# Patient Record
Sex: Female | Born: 1985
Health system: Southern US, Community
[De-identification: ages and names within clinical notes are randomized; demographics above are authoritative.]

## PROBLEM LIST (undated history)

## (undated) ENCOUNTER — Emergency Department (HOSPITAL_COMMUNITY): Admission: EM | Payer: Medicaid Other | Source: Home / Self Care

## (undated) ENCOUNTER — Inpatient Hospital Stay (HOSPITAL_COMMUNITY): Payer: Self-pay

## (undated) DIAGNOSIS — Z789 Other specified health status: Secondary | ICD-10-CM

## (undated) HISTORY — PX: INDUCED ABORTION: SHX677

---

## 2013-10-23 ENCOUNTER — Emergency Department (INDEPENDENT_AMBULATORY_CARE_PROVIDER_SITE_OTHER)
Admission: EM | Admit: 2013-10-23 | Discharge: 2013-10-23 | Disposition: A | Discharge status: Home / Self Care | Payer: Self-pay | Source: Home / Self Care | Attending: Emergency Medicine | Admitting: Emergency Medicine

## 2013-10-23 ENCOUNTER — Encounter (HOSPITAL_COMMUNITY): Payer: Self-pay | Admitting: Emergency Medicine

## 2013-10-23 DIAGNOSIS — M549 Dorsalgia, unspecified: Secondary | ICD-10-CM

## 2013-10-23 LAB — POCT URINALYSIS DIP (DEVICE)
BILIRUBIN URINE: NEGATIVE
GLUCOSE, UA: NEGATIVE mg/dL
Ketones, ur: NEGATIVE mg/dL
LEUKOCYTES UA: NEGATIVE
Nitrite: NEGATIVE
Protein, ur: NEGATIVE mg/dL
Specific Gravity, Urine: >=1.03 (ref 1.005–1.030)
Urobilinogen, UA: 1 mg/dL (ref 0.0–1.0)
pH: 7 (ref 5.0–8.0)

## 2013-10-23 MED ORDER — CYCLOBENZAPRINE HCL 10 MG PO TABS: 10.0000 mg | ORAL_TABLET | Freq: Three times a day (TID) | ORAL | Status: DC | PRN

## 2013-10-23 NOTE — ED Notes (Signed)
Patient c/o back pain , states that she woke up this morning with body aches and a slight headache, denies fever, diarrhea or any urinary symptoms
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
## 2013-10-23 NOTE — ED Provider Notes (Signed)
Medical screening examination/treatment/procedure(s) were performed by non-physician practitioner and as supervising physician I was immediately available for consultation/collaboration.  Serene Kopf, M.D.  Nimra Puccinelli C Malaya Cagley, MD 10/23/13 2143 
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
## 2015-03-06 ENCOUNTER — Emergency Department (HOSPITAL_COMMUNITY)
Admission: EM | Admit: 2015-03-06 | Discharge: 2015-03-06 | Disposition: A | Discharge status: Home / Self Care | Payer: Self-pay | Attending: Emergency Medicine | Admitting: Emergency Medicine

## 2015-03-06 ENCOUNTER — Encounter (HOSPITAL_COMMUNITY): Payer: Self-pay | Admitting: *Deleted

## 2015-03-06 DIAGNOSIS — N923 Ovulation bleeding: Secondary | ICD-10-CM

## 2015-03-06 DIAGNOSIS — N938 Other specified abnormal uterine and vaginal bleeding: Secondary | ICD-10-CM | POA: Insufficient documentation

## 2015-03-06 DIAGNOSIS — Z720 Tobacco use: Secondary | ICD-10-CM | POA: Insufficient documentation

## 2015-03-06 LAB — CBC
HCT: 37 % (ref 36.0–46.0)
HEMOGLOBIN: 12.1 g/dL (ref 12.0–15.0)
MCH: 29.9 pg (ref 26.0–34.0)
MCHC: 32.7 g/dL (ref 30.0–36.0)
MCV: 91.4 fL (ref 78.0–100.0)
PLATELETS: 341 10*3/uL (ref 150–400)
RBC: 4.05 MIL/uL (ref 3.87–5.11)
RDW: 12.9 % (ref 11.5–15.5)
WBC: 7.7 10*3/uL (ref 4.0–10.5)

## 2015-03-06 LAB — I-STAT BETA HCG BLOOD, ED (MC, WL, AP ONLY): I-stat hCG, quantitative: <5 m[IU]/mL (ref <5)

## 2015-03-06 NOTE — ED Provider Notes (Signed)
CSN: [id number]     Arrival date & time 03/06/15  1820 History   First MD Initiated Contact with Patient 03/06/15 2004     Chief Complaint  Patient presents with  . Vaginal Bleeding   (Consider location/radiation/quality/duration/timing/severity/associated sxs/prior Treatment) HPI Margaret Spence is a 29 year old female without any significant medical history presenting with vaginal bleeding. She states her normal menstrual cycle ended 4 days ago. Then 2 days ago she began spotting followed by today when she began to have heavier vaginal bleeding. She reports wearing a tampon and a pad that she has had to change every 2-3 hours. On review of the nursing notes there is mention of abdominal pain with vomiting and diarrhea and mention of chemotherapy pills. Patient denies any abdominal pain, vomiting or diarrhea during my interview. Denies any dysuria or vaginal symptoms, or fever.   History reviewed. No pertinent past medical history. History reviewed. No pertinent past surgical history. History reviewed. No pertinent family history. History  Substance Use Topics  . Smoking status: Current Some Day Smoker  . Smokeless tobacco: Not on file  . Alcohol Use: Not on file     Comment: occ   OB History    No data available     Review of Systems  Constitutional: Negative for fever and chills.  HENT: Negative for sore throat.   Eyes: Negative for visual disturbance.  Respiratory: Negative for cough and shortness of breath.   Cardiovascular: Negative for chest pain and leg swelling.  Gastrointestinal: Negative for nausea, vomiting and diarrhea.  Genitourinary: Positive for vaginal bleeding and menstrual problem. Negative for dysuria, urgency, frequency, flank pain, decreased urine volume, vaginal discharge, vaginal pain and dyspareunia.  Musculoskeletal: Negative for myalgias.  Skin: Negative for rash.  Neurological: Negative for weakness, numbness and headaches.      Allergies   Mushroom extract complex  Home Medications   Prior to Admission medications   Medication Sig Start Date End Date Taking? Authorizing Provider  acetaminophen (TYLENOL) 500 MG tablet Take 500 mg by mouth every 6 (six) hours as needed for mild pain, moderate pain or headache.   Yes Historical Provider, MD  cyclobenzaprine (FLEXERIL) 10 MG tablet Take 1 tablet (10 mg total) by mouth 3 (three) times daily as needed for muscle spasms. Patient not taking: Reported on 03/06/2015 10/23/13   Graylon Good, PA-C   BP 114/69 mmHg  Pulse 76  Temp(Src) 98.2 F (36.8 C) (Oral)  Resp 20  Ht  (1.651 m)  Wt 158 lb 9.6 oz (71.94 kg)  BMI 26.39 kg/m2  SpO2 100%  LMP 03/02/2015 Physical Exam  Constitutional: She appears well-developed and well-nourished. No distress.  HENT:  Head: Normocephalic and atraumatic.  Mouth/Throat: Oropharynx is clear and moist.  Eyes: Conjunctivae are normal.  Neck: Neck supple.  Cardiovascular: Normal rate, regular rhythm and intact distal pulses.   Pulmonary/Chest: Effort normal and breath sounds normal. No respiratory distress. She has no wheezes. She has no rales. She exhibits no tenderness.  Abdominal: Soft. There is no tenderness. There is no rigidity, no rebound, no guarding, no CVA tenderness, no tenderness at McBurney's point and negative Murphy's sign.  No TTP, soft, no masses  Musculoskeletal: She exhibits no tenderness.  Lymphadenopathy:    She has no cervical adenopathy.  Neurological: She is alert.  Skin: Skin is warm and dry. No rash noted. She is not diaphoretic.  Psychiatric: She has a normal mood and affect.  Nursing note and vitals reviewed.  ED Course  Procedures (including critical care time) Labs Review Labs Reviewed  CBC  I-STAT BETA HCG BLOOD, ED ([facility], [facility], [facility] ONLY)    Imaging Review No results found.   EKG Interpretation None      MDM   Final diagnoses:  Vaginal bleeding between periods   29 yo with vaginal bleeding  between menstrual cycles. Negative pregnancy test, Hgb normal, no abd pain, vaginal pain, or dysuria. Discussed with patient no emergent process currently but can be regulated with hormone adjustment but will need to be seen by outpatient gyn. Pt is well-appearing, in no acute distress and vital signs reviewed and not concerning. She appears safe to be discharged.  Discharge include resources to establish care with a Gyn for follow-up. Return precautions provided. Pt aware of plan and in agreement.     Filed Vitals:   03/06/15 1842 03/06/15 1919 03/06/15 2115  BP: 101/69 114/69 109/73  Pulse: 70 76 66  Temp: 98.2 F (36.8 C)    TempSrc: Oral    Resp: 22 20 20   Height: 5\' 5"  (1.651 m)    Weight: 158 lb 9.6 oz (71.94 kg)    SpO2: 99% 100% 100%   Meds given in ED:  Medications - No data to display  Discharge Medication List as of 03/06/2015  9:08 PM       Harle BattiestElizabeth Vonnie Ligman, NP 03/07/15 1600  Nelva Nayobert Beaton, MD 03/10/15 [phone]

## 2015-03-06 NOTE — ED Notes (Signed)
Pt here for vaginal bleeding. sts that she had a full cycle and then stopped. sts a few days later started having spotting and then clots.
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
## 2015-03-06 NOTE — Discharge Instructions (Signed)
Please follow the directions provided. Be sure to [facility] for further evaluation of your vaginal bleeding between periods. Don't hesitate to return for any new, worsening, or concerning symptoms.   SEEK IMMEDIATE MEDICAL CARE IF:  You pass out.  You are changing pads every 15 to 30 minutes.  You have abdominal pain.  You have a fever.  You become sweaty or weak.  You are passing large blood clots from the vagina.  You start to feel nauseous and vomit.   Emergency Department Resource Guide 1) Find a Doctor and Pay Out of Pocket Although you won't have to find out who is covered by your insurance plan, it is a good idea to ask around and get recommendations. You will then need to call the office and see if the doctor you have chosen will accept you as a new patient and what types of options they offer for patients who are self-pay. Some doctors offer discounts or will set up payment plans for their patients who do not have insurance, but you will need to ask so you aren't surprised when you get to your appointment.  2) Contact Your Local Health Department Not all health departments have doctors that can see patients for sick visits, but many do, so it is worth a call to see if yours does. If you don't know where your local health department is, you can check in your phone book. The CDC also has a tool to help you locate your state's health department, and many state websites also have listings of all of their local health departments.  3) Find a Walk-in Clinic If your illness is not likely to be very severe or complicated, you may want to try a walk in clinic. These are popping up all over the country in pharmacies, drugstores, and shopping centers. They're usually staffed by nurse practitioners or physician assistants that have been trained to treat common illnesses and complaints. They're usually fairly quick and inexpensive. However, if you have serious  medical issues or chronic medical problems, these are probably not your best option.  No Primary Care Doctor: - Call Health Connect at  [phone] - they can help you locate a primary care doctor that  accepts your insurance, provides certain services, etc. - Physician Referral Service- [phone]  Chronic Pain Problems: Organization         Address  Phone   Notes  [facility]  [phone] Patients need to be referred by their primary care doctor.   Medication Assistance: Organization         Address  Phone   Notes  [facility] [address] [place], [place] [address] [phone] --Must be a resident of [place] -- Must have NO insurance coverage whatsoever (no Medicaid/ Medicare, etc.) -- The pt. MUST have a primary care doctor that directs their care regularly and follows them in the community   Med[facility]ist  [phone]   [employer]  [phone]    Agencies that provide inexpensive medical care: Organization         Address  Phone   Notes  [facility]  [phone]   [facility]    [phone]   [facility] [address] [place], [place] [address] [phone]   [facility] [address], [place] [phone]   Planned Parenthood    (  [phone]   [facility]    [phone]   [facility]  [address], [place] Phone:  [phone], Fax:  [phone] Hours of Operation:  9 am - 6 pm, M-F.  Also accepts Medicaid/Medicare and self-pay.  [facility] for [address] [place], [address], [place] Phone: [phone], Fax: [phone]. Hours of Operation:  8:30 am - 5:30 pm, M-F.  Also accepts Medicaid and self-pay.  [facility] [place] [address], [place] Phone: [phone]   [place], [place], [place] [phone], Ext. [phone] Mondays & Thursdays: 7-9 AM.  First 15 patients are seen on a first come, first serve basis.    [place] Providers:  Organization         Address  Phone   Notes  [facility] [address], [place] [phone] Also accepts self-pay patients.  [facility] [id number] [place], [place]  [phone]   [place], [address], [place] [phone]   [facility] [address] [place] [phone]   Lucianne Lei [address], [place]   [phone] Only accepts [place] Access [place] patients after they have their name applied to their card.   Self-Pay (no insurance) in [facility]:  Organization         Address  Phone   Notes  Sickle Cell Patients, [facility] [place] [phone]   [facility] [place] [phone]   [facility] [place]  [place], [place], [place] [phone]   [facility]/Dr. Osei-Bonsu  [address], [place] or [address], [place] [phone] Phone number for both [place] and [place] locations is the same.  Urgent Medical and [facility] [address] [place] [phone]   [facility] [address] [place] or [address] [phone]   [facility] [address], [place] [phone], phone; [phone], fax Sees patients 1st and 3rd Saturday of every month.  Must not qualify for public or private insurance (i.e. Medicaid, Medicare, [place] Health Choice, Veterans' Benefits)  Household income should be no more than 200% of the poverty level The clinic cannot treat you if you are pregnant or think you are pregnant  Sexually  transmitted diseases are not treated at the clinic.    Dental Care: Organization         Address  Phone  Notes  [facility] [place] [phone] Accepts children up to age 71 who are enrolled in [place] or [place]; pregnant women with a Medicaid card; and children who have applied for Medicaid or [place] Health Choice, but were declined, whose parents can pay a reduced fee at time of service.  [facility] of [facility]  [address], [place] [phone] Accepts children up to age 85 who are enrolled in [place] or [place]; pregnant women with a Medicaid card; and children who have applied for Medicaid or [place], but were declined, whose parents can pay a  reduced fee at time of service.  [place] [facility] Access PRO[facility]  [place] [phone] Patients are seen by appointment only. Walk-ins are not accepted. [place] will see patients 53 years of age and older. Monday - Tuesday (8am-5pm) Most Wednesdays (8:30-5pm) $30 per visit, cash only  [facility] Adult Dental Access PROGRAM  [address], [facility] [phone] Patients are seen by appointment only. Walk-ins are not accepted. [place] will see patients 61 years of age and older. One Wednesday Evening (Monthly: Volunteer Based).  $30 per visit, cash only  [place]  [phone] for adults; Children under age 32, call Graduate Pediatric Dentistry at [phone]. Children aged 23-14, please call [phone] to request a pediatric application.  Dental services are provided in all areas of dental care including fillings, crowns and bridges, complete and partial dentures, implants, gum treatment, root canals, and extractions. Preventive care is also provided. Treatment is provided to both adults and children. Patients are  selected via a lottery and there is often a waiting list.   [facility] [address], [place]  [phone] [email or web address]   [facility] [address] [place], [place] [phone], Ext. [phone] Second and Fourth Thursday of each month, opens at 6:30 AM; Clinic ends at 9 AM.  Patients are seen on a first-come first-served basis, and a limited number are seen during each clinic.   [facility]  [address] Hillard Danker [place], [place] [phone]   Eligibility Requirements You must have lived in [place], [place], or [place] counties for at least the last three months.   You cannot be eligible for state or federal sponsored [employer], including [employer], [place], or [employer].   You generally cannot be eligible for healthcare insurance through your employer.    How to apply: Eligibility screenings are held every Tuesday and Wednesday afternoon from 1:00 pm until 4:00 pm. You do not need an appointment for the interview!  [facility] [address], [place], [place]   [place]  Schneider Department  [place]  [phone]    Behavioral Health Resources in the Community: Intensive Outpatient Programs Organization         Address  Phone  Notes  [place] [address] [place], [place] [phone]   [facility] Outpatient [address] [place], [place]   ADS: Alcohol & Drug Svcs [address], [place], [place]   [place] [address],  [place], [place] or [phone]   Substance Abuse Resources Organization         Address  Phone  Notes  Alcohol and Drug Services  [phone]   [place]  [phone]   The [place]   Chinita Pester  [phone]     Residential & Outpatient Substance Abuse Program  [phone]   Psychological Services Organization         Address  Phone  Notes  [facility] [place]  [place]  [phone]   [place] [address], [place] or [phone]    Mobile Crisis Teams Organization         Address  Phone  Notes  [facility], Mobile Crisis Care Unit  [phone]   [facility] Psychotherapeutic Services  [address] [place], [place] [phone]   [facility] [address] [place] [phone]    Self-Help/Support Groups Organization         Address  Phone             Notes  [facility] of [place] - variety of support groups  [phone]- I7437963 Call for more information  Narcotics Anonymous (NA), [facility] [address], [employer] [place]  2 meetings at this location   Statistician         Address  Phone  Notes  [facility] [address],    [place]  [phone]   [facility]  [address] [place] [address]272, [place], [place] [phone]   [facility] [address] [place], [place] [phone] Admissions: 8am-3pm M-F  [facility] [address],    [place], [place] [phone]   [facility] [address] [place], [place], [place] [phone]   [facility] [address]  [place], [place] [phone]   [facility] - Intensive Outpatient [address], [place], [place] [phone]   [facility] (Addiction Recovery Care [facility]oc[facility] [address]  [place], [place] [phone] or [phone]   [facility] Services (RTS) [address], [place], [place] [phone] Accepts Medicaid  [facility] [place] [address],  [place] [phone] Substance Abuse/Addiction Treatment   [facility] Organization         Address  Phone  Notes  [facility]  [phone]   Angie Fava, PhD [address] Ervin Knack [place], [place]   [phone] or [phone]   [facility]   [address] [place], [place] [phone]   [facility] [address] [place], [place] [phone] Insurance/Medicaid/sponsorship through [facility] and Families [address]                                    [place], [place] [phone] Therapy/tele-psych/case  [facility] [address], [place] [phone]    Dr. Lolly Mustache  [phone]   [facility] [place]  United Way [facility] Dept. 1) [address] [place] 2) [address] [place] 3)  [address], [place] [phone]  [phone]   [facility] Child Abuse Hotline 626-703-2280 or 7801410699 (After Hours)

## 2015-12-16 ENCOUNTER — Emergency Department (INDEPENDENT_AMBULATORY_CARE_PROVIDER_SITE_OTHER)
Admission: EM | Admit: 2015-12-16 | Discharge: 2015-12-16 | Disposition: A | Discharge status: Home / Self Care | Payer: BLUE CROSS/BLUE SHIELD | Source: Home / Self Care | Attending: Emergency Medicine | Admitting: Emergency Medicine

## 2015-12-16 ENCOUNTER — Encounter (HOSPITAL_COMMUNITY): Payer: Self-pay | Admitting: Emergency Medicine

## 2015-12-16 DIAGNOSIS — R197 Diarrhea, unspecified: Secondary | ICD-10-CM | POA: Diagnosis not present

## 2015-12-16 DIAGNOSIS — R141 Gas pain: Secondary | ICD-10-CM | POA: Diagnosis not present

## 2015-12-16 MED ORDER — ONDANSETRON HCL 4 MG PO TABS: 4.0000 mg | ORAL_TABLET | Freq: Three times a day (TID) | ORAL | Status: DC | PRN

## 2015-12-16 NOTE — Discharge Instructions (Signed)
I suspect your diarrhea and gas pains are coming from your change in diet. I do not see any sign of gallbladder problems, infection, or other serious condition. If you develop more nausea, you can fill the prescription for Zofran. You can take over-the-counter Gas-X to help with the gas pains. Please try and avoid fast food and greasy foods. Follow-up if not improving in the next several days.

## 2015-12-16 NOTE — ED Provider Notes (Signed)
CSN: [id number]     Arrival date & time 12/16/15  1614 History   First MD Initiated Contact with Patient 12/16/15 1655     Chief Complaint  Patient presents with  . Abdominal Pain   (Consider location/radiation/quality/duration/timing/severity/associated sxs/prior Treatment) HPI  She is a 30 year old woman here for evaluation of abdominal pain. She states that since Tuesday she has been having diarrhea and crampy left-sided abdominal pain. She states the diarrhea has improved. She will only develop the pain before a bowel movement or passing gas. Once she passes gas or has a bowel movement, the pain resolves. She has had some vomiting this morning. She denies nausea. No dysuria or urinary symptoms. No vaginal discharge. No fevers or chills. She has not tried any medications.  She does state for the last week she has been eating a lot more fast food and greasy foods than normal. She states she typically cooks at home.  History reviewed. No pertinent past medical history. History reviewed. No pertinent past surgical history. No family history on file. Social History  Substance Use Topics  . Smoking status: Current Some Day Smoker  . Smokeless tobacco: None  . Alcohol Use: None     Comment: occ   OB History    No data available     Review of Systems As in history of present illness Allergies  Mushroom extract complex  Home Medications   Prior to Admission medications   Medication Sig Start Date End Date Taking? Authorizing Provider  acetaminophen (TYLENOL) 500 MG tablet Take 500 mg by mouth every 6 (six) hours as needed for mild pain, moderate pain or headache.    Historical Provider, MD  ondansetron (ZOFRAN) 4 MG tablet Take 1 tablet (4 mg total) by mouth every 8 (eight) hours as needed for nausea or vomiting. 12/16/15   Charm RingsErin J Jessey Stehlin, MD   Meds Ordered and Administered this Visit  Medications - No data to display  BP 114/59 mmHg  Pulse 65  Temp(Src) 98.5 F (36.9 C) (Oral)   SpO2 100%  LMP 11/30/2015 No data found.   Physical Exam  Constitutional: She is oriented to person, place, and time. She appears well-developed and well-nourished. No distress.  Cardiovascular: Normal rate.   Pulmonary/Chest: Effort normal.  Abdominal: Soft. Bowel sounds are normal. She exhibits no distension. There is no tenderness. There is no rebound and no guarding.  Neurological: She is alert and oriented to person, place, and time.    ED Course  Procedures (including critical care time)  Labs Review Labs Reviewed - No data to display  Imaging Review No results found.    MDM   1. Diarrhea, unspecified type   2. Gas pain    No abdominal pain currently. Abdominal exam is completely benign. I suspect her symptoms are due to the diet change. Discussed symptomatic treatment with Zofran if needed and over-the-counter Gas-X. Return to work note given at her request. Return precautions reviewed.    Charm RingsErin J Wes Lezotte, MD 12/16/15 1740

## 2015-12-16 NOTE — ED Notes (Addendum)
Left lower abdominal cramping since Tuesday.  Intermittent pain.  Associated with diarrhea, reports 3-4 episodes of diarrhea, "not really sure".  Reports episodes of diarrhea has slowed.  Report her job wants a note clearing her for work.  Patient reports increase in flatulence. Reports very painful cramping prior to passing gas

## 2016-02-05 ENCOUNTER — Encounter (HOSPITAL_COMMUNITY): Payer: Self-pay | Admitting: *Deleted

## 2016-02-05 ENCOUNTER — Inpatient Hospital Stay (HOSPITAL_COMMUNITY)
Admission: AD | Admit: 2016-02-05 | Discharge: 2016-02-05 | Disposition: A | Discharge status: Home / Self Care | Payer: BLUE CROSS/BLUE SHIELD | Source: Ambulatory Visit | Attending: Family Medicine | Admitting: Family Medicine

## 2016-02-05 DIAGNOSIS — N926 Irregular menstruation, unspecified: Secondary | ICD-10-CM

## 2016-02-05 DIAGNOSIS — N760 Acute vaginitis: Secondary | ICD-10-CM | POA: Insufficient documentation

## 2016-02-05 DIAGNOSIS — B9689 Other specified bacterial agents as the cause of diseases classified elsewhere: Secondary | ICD-10-CM

## 2016-02-05 DIAGNOSIS — Z113 Encounter for screening for infections with a predominantly sexual mode of transmission: Secondary | ICD-10-CM

## 2016-02-05 DIAGNOSIS — Z30011 Encounter for initial prescription of contraceptive pills: Secondary | ICD-10-CM

## 2016-02-05 DIAGNOSIS — N925 Other specified irregular menstruation: Secondary | ICD-10-CM | POA: Insufficient documentation

## 2016-02-05 HISTORY — DX: Other specified health status: Z78.9

## 2016-02-05 LAB — URINALYSIS, ROUTINE W REFLEX MICROSCOPIC
BILIRUBIN URINE: NEGATIVE
Glucose, UA: NEGATIVE mg/dL
Hgb urine dipstick: NEGATIVE
Ketones, ur: NEGATIVE mg/dL
LEUKOCYTES UA: NEGATIVE
NITRITE: NEGATIVE
Protein, ur: NEGATIVE mg/dL
SPECIFIC GRAVITY, URINE: 1.025 (ref 1.005–1.030)
pH: 6.5 (ref 5.0–8.0)

## 2016-02-05 LAB — POCT PREGNANCY, URINE: Preg Test, Ur: NEGATIVE

## 2016-02-05 LAB — WET PREP, GENITAL
Sperm: NONE SEEN
Trich, Wet Prep: NONE SEEN
Yeast Wet Prep HPF POC: NONE SEEN

## 2016-02-05 LAB — CBC
HEMATOCRIT: 34.6 % — AB (ref 36.0–46.0)
Hemoglobin: 11.5 g/dL — ABNORMAL LOW (ref 12.0–15.0)
MCH: 30.6 pg (ref 26.0–34.0)
MCHC: 33.2 g/dL (ref 30.0–36.0)
MCV: 92 fL (ref 78.0–100.0)
Platelets: 365 10*3/uL (ref 150–400)
RBC: 3.76 MIL/uL — AB (ref 3.87–5.11)
RDW: 13.3 % (ref 11.5–15.5)
WBC: 7.1 10*3/uL (ref 4.0–10.5)

## 2016-02-05 MED ORDER — NORGESTIMATE-ETH ESTRADIOL 0.25-35 MG-MCG PO TABS: 1.0000 | ORAL_TABLET | Freq: Every day | ORAL | Status: DC

## 2016-02-05 MED ORDER — METRONIDAZOLE 500 MG PO TABS: 500.0000 mg | ORAL_TABLET | Freq: Two times a day (BID) | ORAL | Status: DC

## 2016-02-05 NOTE — MAU Note (Signed)
Started spotting on the 10th, little earlier she expected her period, period came on 12-16, had a day off then started spotting again.  Sometimes it is bright red, sometimes dark red or brown. Starting to get an odor.

## 2016-02-05 NOTE — MAU Provider Note (Signed)
History     CSN: [id number]  Arrival date and time: 02/05/16 1416   First Provider Initiated Contact with Patient 02/05/16 1506       Chief Complaint  Patient presents with  . Vaginal Bleeding  . vag odor    HPI  Margaret Spence is a 30 y.o. female who presents for prolonged menses & vaginal odor. Vaginal spotting that started 4/10, became heavier 4/12-4/16 like a normal periods, then returned to light red spotting since then. Reports history of regular periods; states period comes every month but on different days and last different amounts of time.  Has not been on anything for birth control in over 2 years. Reports vaginal odor in the last few days but has noticed any new discharge. Denies itching/irritation. Desires STD testing at this time.   OB History    Gravida Para Term Preterm AB TAB SAB Ectopic Multiple Living   Past Medical History  Diagnosis Date  . Medical history non-contributory     Past Surgical History  Procedure Laterality Date  . Vaginal delivery    . Induced abortion      History reviewed. No pertinent family history.  Social History  Substance Use Topics  . Smoking status: Never Smoker   . Smokeless tobacco: None  . Alcohol Use: Yes     Comment: 1-2 X per week    Allergies:  Allergies  Allergen Reactions  . Mushroom Extract Complex Rash    Prescriptions prior to admission  Medication Sig Dispense Refill Last Dose  . acetaminophen (TYLENOL) 500 MG tablet Take 500 mg by mouth every 6 (six) hours as needed for mild pain, moderate pain or headache.   1 week ago  . ondansetron (ZOFRAN) 4 MG tablet Take 1 tablet (4 mg total) by mouth every 8 (eight) hours as needed for nausea or vomiting. 20 tablet 0     Review of Systems  Constitutional: Negative.   Gastrointestinal: Negative.   Genitourinary: Negative for dysuria.       + vaginal bleeding    Physical Exam   Blood pressure 108/63, pulse 74, temperature  98.2 F (36.8 C), temperature source Oral, resp. rate 16, height  (1.575 m), weight 150 lb 9.6 oz (68.312 kg), last menstrual period 01/22/2016.  Physical Exam  Nursing note and vitals reviewed. Constitutional: She is oriented to person, place, and time. She appears well-developed and well-nourished. No distress.  HENT:  Head: Normocephalic and atraumatic.  Eyes: Conjunctivae are normal. Right eye exhibits no discharge. Left eye exhibits no discharge. No scleral icterus.  Neck: Normal range of motion.  Cardiovascular: Normal rate, regular rhythm and normal heart sounds.   No murmur heard. Respiratory: Effort normal and breath sounds normal. No respiratory distress. She has no wheezes.  GI: Soft.  Genitourinary: Uterus normal. Cervix exhibits no motion tenderness and no friability. No bleeding in the vagina. Vaginal discharge (minimal amount of thin tan discharge with foul odor) found.  No blood on exam  Neurological: She is alert and oriented to person, place, and time.  Skin: Skin is warm and dry. She is not diaphoretic.  Psychiatric: She has a normal mood and affect. Her behavior is normal. Judgment and thought content normal.    MAU Course  Procedures Results for orders placed or performed during the hospital encounter of 02/05/16 (from the past 24 hour(s))  Urinalysis, Routine w reflex microscopic (not  at [facility])     Status: None   Collection Time: 02/05/16  2:39 PM  Result Value Ref Range   Color, Urine YELLOW YELLOW   APPearance CLEAR CLEAR   Specific Gravity, Urine 1.025 1.005 - 1.030   pH 6.5 5.0 - 8.0   Glucose, UA NEGATIVE NEGATIVE mg/dL   Hgb urine dipstick NEGATIVE NEGATIVE   Bilirubin Urine NEGATIVE NEGATIVE   Ketones, ur NEGATIVE NEGATIVE mg/dL   Protein, ur NEGATIVE NEGATIVE mg/dL   Nitrite NEGATIVE NEGATIVE   Leukocytes, UA NEGATIVE NEGATIVE  Pregnancy, urine POC     Status: None   Collection Time: 02/05/16  2:40 PM  Result Value Ref Range   Preg Test, Ur  NEGATIVE NEGATIVE  CBC     Status: Abnormal   Collection Time: 02/05/16  2:55 PM  Result Value Ref Range   WBC 7.1 4.0 - 10.5 K/uL   RBC 3.76 (L) 3.87 - 5.11 MIL/uL   Hemoglobin 11.5 (L) 12.0 - 15.0 g/dL   HCT 16.134.6 (L) 09.636.0 - 04.546.0 %   MCV 92.0 78.0 - 100.0 fL   MCH 30.6 26.0 - 34.0 pg   MCHC 33.2 30.0 - 36.0 g/dL   RDW 40.913.3 81.111.5 - 91.415.5 %   Platelets 365 150 - 400 K/uL  Wet prep, genital     Status: Abnormal   Collection Time: 02/05/16  3:34 PM  Result Value Ref Range   Yeast Wet Prep HPF POC NONE SEEN NONE SEEN   Trich, Wet Prep NONE SEEN NONE SEEN   Clue Cells Wet Prep HPF POC PRESENT (A) NONE SEEN   WBC, Wet Prep HPF POC MANY (A) NONE SEEN   Sperm NONE SEEN     MDM UPT negative No blood on exam CBC, HIV, RPR, GC/CT, wet prep Pt would like oral contraception - no contraindications. Will start patient on COC and give list of gyns in area for management.   Assessment and Plan  A: 1. Abnormal menses   2. Screen for STD (sexually transmitted disease)   3. Encounter for initial prescription of contraceptive pills   4. BV (bacterial vaginosis)     P: Discharge home Rx sprintec & flagyl (no alcohol) List of ob/gyn providers given GC/CT, HIV, RPR pending  Judeth Hornrin Minnie Legros 02/05/2016, 3:06 PM

## 2016-02-05 NOTE — Discharge Instructions (Signed)
Contraception Choices Contraception (birth control) is the use of any methods or devices to prevent pregnancy. Below are some methods to help avoid pregnancy. HORMONAL METHODS   Contraceptive implant. This is a thin, plastic tube containing progesterone hormone. It does not contain estrogen hormone. Your health care provider inserts the tube in the inner part of the upper arm. The tube can remain in place for up to 3 years. After 3 years, the implant must be removed. The implant prevents the ovaries from releasing an egg (ovulation), thickens the cervical mucus to prevent sperm from entering the uterus, and thins the lining of the inside of the uterus.  Progesterone-only injections. These injections are given every 3 months by your health care provider to prevent pregnancy. This synthetic progesterone hormone stops the ovaries from releasing eggs. It also thickens cervical mucus and changes the uterine lining. This makes it harder for sperm to survive in the uterus.  Birth control pills. These pills contain estrogen and progesterone hormone. They work by preventing the ovaries from releasing eggs (ovulation). They also cause the cervical mucus to thicken, preventing the sperm from entering the uterus. Birth control pills are prescribed by a health care provider.Birth control pills can also be used to treat heavy periods.  Minipill. This type of birth control pill contains only the progesterone hormone. They are taken every day of each month and must be prescribed by your health care provider.  Birth control patch. The patch contains hormones similar to those in birth control pills. It must be changed once a week and is prescribed by a health care provider.  Vaginal ring. The ring contains hormones similar to those in birth control pills. It is left in the vagina for 3 weeks, removed for 1 week, and then a new one is put back in place. The patient must be comfortable inserting and removing the ring  from the vagina.A health care provider's prescription is necessary.  Emergency contraception. Emergency contraceptives prevent pregnancy after unprotected sexual intercourse. This pill can be taken right after sex or up to 5 days after unprotected sex. It is most effective the sooner you take the pills after having sexual intercourse. Most emergency contraceptive pills are available without a prescription. Check with your pharmacist. Do not use emergency contraception as your only form of birth control. BARRIER METHODS   Female condom. This is a thin sheath (latex or rubber) that is worn over the penis during sexual intercourse. It can be used with spermicide to increase effectiveness.  Female condom. This is a soft, loose-fitting sheath that is put into the vagina before sexual intercourse.  Diaphragm. This is a soft, latex, dome-shaped barrier that must be fitted by a health care provider. It is inserted into the vagina, along with a spermicidal jelly. It is inserted before intercourse. The diaphragm should be left in the vagina for 6 to 8 hours after intercourse.  Cervical cap. This is a round, soft, latex or plastic cup that fits over the cervix and must be fitted by a health care provider. The cap can be left in place for up to 48 hours after intercourse.  Sponge. This is a soft, circular piece of polyurethane foam. The sponge has spermicide in it. It is inserted into the vagina after wetting it and before sexual intercourse.  Spermicides. These are chemicals that kill or block sperm from entering the cervix and uterus. They come in the form of creams, jellies, suppositories, foam, or tablets. They do not require a  prescription. They are inserted into the vagina with an applicator before having sexual intercourse. The process must be repeated every time you have sexual intercourse. INTRAUTERINE CONTRACEPTION  Intrauterine device (IUD). This is a T-shaped device that is put in a woman's uterus  during a menstrual period to prevent pregnancy. There are 2 types:  Copper IUD. This type of IUD is wrapped in copper wire and is placed inside the uterus. Copper makes the uterus and fallopian tubes produce a fluid that kills sperm. It can stay in place for 10 years.  Hormone IUD. This type of IUD contains the hormone progestin (synthetic progesterone). The hormone thickens the cervical mucus and prevents sperm from entering the uterus, and it also thins the uterine lining to prevent implantation of a fertilized egg. The hormone can weaken or kill the sperm that get into the uterus. It can stay in place for 3-5 years, depending on which type of IUD is used. PERMANENT METHODS OF CONTRACEPTION  Female tubal ligation. This is when the woman's fallopian tubes are surgically sealed, tied, or blocked to prevent the egg from traveling to the uterus.  Hysteroscopic sterilization. This involves placing a small coil or insert into each fallopian tube. Your doctor uses a technique called hysteroscopy to do the procedure. The device causes scar tissue to form. This results in permanent blockage of the fallopian tubes, so the sperm cannot fertilize the egg. It takes about 3 months after the procedure for the tubes to become blocked. You must use another form of birth control for these 3 months.  Female sterilization. This is when the female has the tubes that carry sperm tied off (vasectomy).This blocks sperm from entering the vagina during sexual intercourse. After the procedure, the man can still ejaculate fluid (semen). NATURAL PLANNING METHODS  Natural family planning. This is not having sexual intercourse or using a barrier method (condom, diaphragm, cervical cap) on days the woman could become pregnant.  Calendar method. This is keeping track of the length of each menstrual cycle and identifying when you are fertile.  Ovulation method. This is avoiding sexual intercourse during ovulation.  Symptothermal  method. This is avoiding sexual intercourse during ovulation, using a thermometer and ovulation symptoms.  Post-ovulation method. This is timing sexual intercourse after you have ovulated. Regardless of which type or method of contraception you choose, it is important that you use condoms to protect against the transmission of sexually transmitted infections (STIs). Talk with your health care provider about which form of contraception is most appropriate for you.   This information is not intended to replace advice given to you by your health care provider. Make sure you discuss any questions you have with your health care provider.   Document Released: 09/30/2005 Document Revised: 10/05/2013 Document Reviewed: 03/25/2013 Elsevier Interactive Patient Education 2016 [employer]       [facility] [place] [facility]   Francoise CeoBernard Marshall      Phone: [phone]  [facility]     Phone: [phone]  [facility] [place]  Phone: [phone]  [facility] [place]  Phone: [phone]  [facility]    Phone: [phone]   [facility]    Phone: [phone]  [facility]    Phone: [phone]  [facility]    Phone: [phone]  [facility]    Phone: [phone]  [facility] Phone: [phone]   [facility]  Phone: [phone]  [facility]    Phone: [phone]  [facility]   Phone: [phone]  [facility]      Phone: [phone]  [facility]    Phone: [phone]  [facility]     Phone: [phone]

## 2016-02-06 LAB — GC/CHLAMYDIA PROBE AMP (~~LOC~~) NOT AT ARMC
CHLAMYDIA, DNA PROBE: POSITIVE — AB
Neisseria Gonorrhea: NEGATIVE

## 2016-02-06 LAB — HIV ANTIBODY (ROUTINE TESTING W REFLEX): HIV SCREEN 4TH GENERATION: NONREACTIVE

## 2016-02-07 ENCOUNTER — Telehealth (HOSPITAL_COMMUNITY): Payer: Self-pay | Admitting: *Deleted

## 2016-02-07 DIAGNOSIS — A749 Chlamydial infection, unspecified: Secondary | ICD-10-CM

## 2016-02-07 MED ORDER — AZITHROMYCIN 500 MG PO TABS: ORAL_TABLET | ORAL | Status: DC

## 2016-02-07 NOTE — Telephone Encounter (Signed)
Telephone call to patient regarding positive chlamydia culture, patient notified.  Rx routed to patient's pharmacy for treatment.  Instructed patient to notify her partner for treatment and to abstain from sex for seven days post treatment.  Report faxed to health department.  

## 2016-10-19 LAB — OB RESULTS CONSOLE GBS: STREP GROUP B AG: POSITIVE

## 2017-02-22 ENCOUNTER — Encounter (HOSPITAL_COMMUNITY): Payer: Self-pay

## 2017-02-22 ENCOUNTER — Inpatient Hospital Stay (HOSPITAL_COMMUNITY)
Admission: AD | Admit: 2017-02-22 | Discharge: 2017-02-22 | Disposition: A | Discharge status: Home / Self Care | Payer: Medicaid Other | Source: Ambulatory Visit | Attending: Family Medicine | Admitting: Family Medicine

## 2017-02-22 DIAGNOSIS — E860 Dehydration: Secondary | ICD-10-CM

## 2017-02-22 DIAGNOSIS — Z3A08 8 weeks gestation of pregnancy: Secondary | ICD-10-CM | POA: Insufficient documentation

## 2017-02-22 DIAGNOSIS — O99281 Endocrine, nutritional and metabolic diseases complicating pregnancy, first trimester: Secondary | ICD-10-CM | POA: Diagnosis not present

## 2017-02-22 DIAGNOSIS — O219 Vomiting of pregnancy, unspecified: Secondary | ICD-10-CM | POA: Insufficient documentation

## 2017-02-22 LAB — OB RESULTS CONSOLE HIV ANTIBODY (ROUTINE TESTING): HIV: NONREACTIVE

## 2017-02-22 LAB — OB RESULTS CONSOLE HEPATITIS B SURFACE ANTIGEN: HEP B S AG: NEGATIVE

## 2017-02-22 LAB — URINALYSIS, ROUTINE W REFLEX MICROSCOPIC
BILIRUBIN URINE: NEGATIVE
Bacteria, UA: NONE SEEN
Glucose, UA: NEGATIVE mg/dL
HGB URINE DIPSTICK: NEGATIVE
KETONES UR: 80 mg/dL — AB
NITRITE: NEGATIVE
Protein, ur: 30 mg/dL — AB
Specific Gravity, Urine: 1.03 (ref 1.005–1.030)
pH: 6 (ref 5.0–8.0)

## 2017-02-22 LAB — POCT PREGNANCY, URINE: Preg Test, Ur: POSITIVE — AB

## 2017-02-22 LAB — COMPREHENSIVE METABOLIC PANEL
ALT: 12 U/L — ABNORMAL LOW (ref 14–54)
AST: 17 U/L (ref 15–41)
Albumin: 4 g/dL (ref 3.5–5.0)
Alkaline Phosphatase: 52 U/L (ref 38–126)
Anion gap: 8 (ref 5–15)
BILIRUBIN TOTAL: 1 mg/dL (ref 0.3–1.2)
BUN: 7 mg/dL (ref 6–20)
CO2: 25 mmol/L (ref 22–32)
Calcium: 9.5 mg/dL (ref 8.9–10.3)
Chloride: 99 mmol/L — ABNORMAL LOW (ref 101–111)
Creatinine, Ser: 0.59 mg/dL (ref 0.44–1.00)
GFR calc Af Amer: >60 mL/min (ref >60)
GFR calc non Af Amer: >60 mL/min (ref >60)
Glucose, Bld: 93 mg/dL (ref 65–99)
POTASSIUM: 3.8 mmol/L (ref 3.5–5.1)
Sodium: 132 mmol/L — ABNORMAL LOW (ref 135–145)
Total Protein: 7.3 g/dL (ref 6.5–8.1)

## 2017-02-22 LAB — OB RESULTS CONSOLE RUBELLA ANTIBODY, IGM: RUBELLA: NON-IMMUNE/NOT IMMUNE

## 2017-02-22 LAB — CBC
HEMATOCRIT: 35.3 % — AB (ref 36.0–46.0)
Hemoglobin: 12.3 g/dL (ref 12.0–15.0)
MCH: 31.5 pg (ref 26.0–34.0)
MCHC: 34.8 g/dL (ref 30.0–36.0)
MCV: 90.5 fL (ref 78.0–100.0)
Platelets: 378 10*3/uL (ref 150–400)
RBC: 3.9 MIL/uL (ref 3.87–5.11)
RDW: 13.6 % (ref 11.5–15.5)
WBC: 8.9 10*3/uL (ref 4.0–10.5)

## 2017-02-22 LAB — OB RESULTS CONSOLE ABO/RH: RH TYPE: NEGATIVE

## 2017-02-22 LAB — OB RESULTS CONSOLE GC/CHLAMYDIA
Chlamydia: NEGATIVE
GC PROBE AMP, GENITAL: NEGATIVE

## 2017-02-22 LAB — OB RESULTS CONSOLE ANTIBODY SCREEN: ANTIBODY SCREEN: NEGATIVE

## 2017-02-22 LAB — OB RESULTS CONSOLE RPR: RPR: NONREACTIVE

## 2017-02-22 MED ORDER — FAMOTIDINE IN NACL 20-0.9 MG/50ML-% IV SOLN
20.0000 mg | Freq: Once | INTRAVENOUS | Status: AC
  Administered 2017-02-22: 20 mg via INTRAVENOUS

## 2017-02-22 MED ORDER — FAMOTIDINE IN NACL 20-0.9 MG/50ML-% IV SOLN
INTRAVENOUS | Status: AC
  Filled 2017-02-22: qty 50

## 2017-02-22 MED ORDER — PROMETHAZINE HCL 25 MG/ML IJ SOLN
25.0000 mg | Freq: Once | INTRAMUSCULAR | Status: AC
  Administered 2017-02-22: 25 mg via INTRAVENOUS
  Filled 2017-02-22: qty 1

## 2017-02-22 MED ORDER — DEXTROSE 5 % IN LACTATED RINGERS IV BOLUS
1000.0000 mL | Freq: Once | INTRAVENOUS | Status: AC
  Administered 2017-02-22: 1000 mL via INTRAVENOUS

## 2017-02-22 MED ORDER — DIPHENHYDRAMINE HCL 50 MG/ML IJ SOLN: 12.5000 mg | Freq: Once | INTRAMUSCULAR | Status: DC

## 2017-02-22 MED ORDER — PROMETHAZINE HCL 25 MG PO TABS: 25.0000 mg | ORAL_TABLET | Freq: Four times a day (QID) | ORAL | 0 refills | Status: DC | PRN

## 2017-02-22 MED ORDER — DIMENHYDRINATE 50 MG/ML IJ SOLN: 50.0000 mg | Freq: Once | INTRAMUSCULAR | Status: DC

## 2017-02-22 MED ORDER — M.V.I. ADULT IV INJ
Freq: Once | INTRAVENOUS | Status: AC
  Administered 2017-02-22: 05:00:00 via INTRAVENOUS
  Filled 2017-02-22: qty 10

## 2017-02-22 NOTE — MAU Note (Signed)
Uncontrolled vomiting and nausea since this morning. 2 sharp pains in low abd within last few hours. Diarrhea x 2-3 today. No fever. Home pregnancy test positive on Wednesday. No bleeding.

## 2017-02-22 NOTE — MAU Provider Note (Signed)
History     CSN: [id number]  Arrival date and time: 02/22/17 [id number]   First Provider Initiated Contact with Patient 02/22/17 [phone]      Chief Complaint  Patient presents with  . Emesis During Pregnancy   HPI Margaret Spence is a 31 y.o. G3P1011 at 4359w2d who presents with nausea & vomiting. Symptoms began yesterday. Reports vomiting countless times today. Has had several episodes of loose stools today as well. Denies abdominal pain, fever, vaginal bleeding. Has not started prenatal care yet.   OB History    Gravida Para Term Preterm AB Living   3 1 1   1 1    SAB TAB Ectopic Multiple Live Births     1            Past Medical History:  Diagnosis Date  . Medical history non-contributory     Past Surgical History:  Procedure Laterality Date  . INDUCED ABORTION    . VAGINAL DELIVERY      History reviewed. No pertinent family history.  Social History  Substance Use Topics  . Smoking status: Never Smoker  . Smokeless tobacco: Never Used  . Alcohol use Yes     Comment: 1-2 X per week    Allergies:  Allergies  Allergen Reactions  . Other     Tree nuts - rash  . Mushroom Extract Complex Rash    Prescriptions Prior to Admission  Medication Sig Dispense Refill Last Dose  . acetaminophen (TYLENOL) 500 MG tablet Take 500 mg by mouth every 6 (six) hours as needed for mild pain, moderate pain or headache. Reported on 02/05/2016   Not Taking at Unknown time  . azithromycin (ZITHROMAX) 500 MG tablet Take two tablets by mouth once. 2 tablet 0   . metroNIDAZOLE (FLAGYL) 500 MG tablet Take 1 tablet (500 mg total) by mouth 2 (two) times daily. 14 tablet 0   . norgestimate-ethinyl estradiol (SPRINTEC 28) 0.25-35 MG-MCG tablet Take 1 tablet by mouth daily. 1 Package 2     Review of Systems  Constitutional: Negative.   Gastrointestinal: Positive for diarrhea, nausea and vomiting. Negative for abdominal pain and constipation.  Genitourinary: Negative.    Physical Exam    Blood pressure (!) 104/55, pulse 71, temperature 98.1 F (36.7 C), temperature source Oral, resp. rate 15, height 5\' 5"  (1.651 m), weight 131 lb (59.4 kg), last menstrual period 12/26/2016, SpO2 99 %.  Physical Exam  Nursing note and vitals reviewed. Constitutional: She is oriented to person, place, and time. She appears well-developed and well-nourished. No distress.  HENT:  Head: Normocephalic and atraumatic.  Eyes: Conjunctivae are normal. Right eye exhibits no discharge. Left eye exhibits no discharge. No scleral icterus.  Neck: Normal range of motion.  Respiratory: Effort normal. No respiratory distress.  Neurological: She is alert and oriented to person, place, and time.  Skin: Skin is warm and dry. She is not diaphoretic.  Psychiatric: She has a normal mood and affect. Her behavior is normal. Judgment and thought content normal.    MAU Course  Procedures Results for orders placed or performed during the hospital encounter of 02/22/17 (from the past 24 hour(s))  Urinalysis, Routine w reflex microscopic (not [facility])     Status: Abnormal   Collection Time: 02/22/17  2:36 AM  Result Value Ref Range   Color, Urine YELLOW YELLOW   APPearance HAZY (A) CLEAR   Specific Gravity, Urine 1.030 1.005 - 1.030   pH 6.0 5.0 - 8.0  Glucose, UA NEGATIVE NEGATIVE mg/dL   Hgb urine dipstick NEGATIVE NEGATIVE   Bilirubin Urine NEGATIVE NEGATIVE   Ketones, ur 80 (A) NEGATIVE mg/dL   Protein, ur 30 (A) NEGATIVE mg/dL   Nitrite NEGATIVE NEGATIVE   Leukocytes, UA TRACE (A) NEGATIVE   RBC / HPF 0-5 0 - 5 RBC/hpf   WBC, UA 0-5 0 - 5 WBC/hpf   Bacteria, UA NONE SEEN NONE SEEN   Squamous Epithelial / LPF 6-30 (A) NONE SEEN   Mucous PRESENT   Pregnancy, urine POC     Status: Abnormal   Collection Time: 02/22/17  2:46 AM  Result Value Ref Range   Preg Test, Ur POSITIVE (A) NEGATIVE  CBC     Status: Abnormal   Collection Time: 02/22/17  3:20 AM  Result Value Ref Range   WBC 8.9 4.0 - 10.5  K/uL   RBC 3.90 3.87 - 5.11 MIL/uL   Hemoglobin 12.3 12.0 - 15.0 g/dL   HCT 16.1 (L) 09.6 - 04.5 %   MCV 90.5 78.0 - 100.0 fL   MCH 31.5 26.0 - 34.0 pg   MCHC 34.8 30.0 - 36.0 g/dL   RDW 40.9 81.1 - 91.4 %   Platelets 378 150 - 400 K/uL  Comprehensive metabolic panel     Status: Abnormal   Collection Time: 02/22/17  3:20 AM  Result Value Ref Range   Sodium 132 (L) 135 - 145 mmol/L   Potassium 3.8 3.5 - 5.1 mmol/L   Chloride 99 (L) 101 - 111 mmol/L   CO2 25 22 - 32 mmol/L   Glucose, Bld 93 65 - 99 mg/dL   BUN 7 6 - 20 mg/dL   Creatinine, Ser 7.82 0.44 - 1.00 mg/dL   Calcium 9.5 8.9 - 95.6 mg/dL   Total Protein 7.3 6.5 - 8.1 g/dL   Albumin 4.0 3.5 - 5.0 g/dL   AST 17 15 - 41 U/L   ALT 12 (L) 14 - 54 U/L   Alkaline Phosphatase 52 38 - 126 U/L   Total Bilirubin 1.0 0.3 - 1.2 mg/dL   GFR calc non Af Amer >60 >60 mL/min   GFR calc Af Amer >60 >60 mL/min   Anion gap 8 5 - 15    MDM + upt U/a, 80+ ketones IV fluids, D5LR & MVI in LR Phenergan 25 mg & pepcid 20 mg IV Pt reports improvement in symptoms and no longer vomiting  Assessment and Plan  A; 1. Nausea and vomiting during pregnancy prior to [redacted] weeks gestation   2. Dehydration    P; Discharge home Rx phenergan Start prenatal care Discussed reasons to return to MAU  Judeth Horn 02/22/2017, 3:18 AM

## 2017-02-22 NOTE — Discharge Instructions (Signed)
Morning Sickness °Morning sickness is when you feel sick to your stomach (nauseous) during pregnancy. This nauseous feeling may or may not come with vomiting. It often occurs in the morning but can be a problem any time of day. Morning sickness is most common during the first trimester, but it may continue throughout pregnancy. While morning sickness is unpleasant, it is usually harmless unless you develop severe and continual vomiting (hyperemesis gravidarum). This condition requires more intense treatment. °What are the causes? °The cause of morning sickness is not completely known but seems to be related to normal hormonal changes that occur in pregnancy. °What increases the risk? °You are at greater risk if you: °· Experienced nausea or vomiting before your pregnancy. °· Had morning sickness during a previous pregnancy. °· Are pregnant with more than one baby, such as twins. ° °How is this treated? °Do not use any medicines (prescription, over-the-counter, or herbal) for morning sickness without first talking to your health care provider. Your health care provider may prescribe or recommend: °· Vitamin B6 supplements. °· Anti-nausea medicines. °· The herbal medicine ginger. ° °Follow these instructions at home: °· Only take over-the-counter or prescription medicines as directed by your health care provider. °· Taking multivitamins before getting pregnant can prevent or decrease the severity of morning sickness in most women. °· Eat a piece of dry toast or unsalted crackers before getting out of bed in the morning. °· Eat five or six small meals a day. °· Eat dry and bland foods (rice, baked potato). Foods high in carbohydrates are often helpful. °· Do not drink liquids with your meals. Drink liquids between meals. °· Avoid greasy, fatty, and spicy foods. °· Get someone to cook for you if the smell of any food causes nausea and vomiting. °· If you feel nauseous after taking prenatal vitamins, take the vitamins at  night or with a snack. °· Snack on protein foods (nuts, yogurt, cheese) between meals if you are hungry. °· Eat unsweetened gelatins for desserts. °· Wearing an acupressure wristband (worn for sea sickness) may be helpful. °· Acupuncture may be helpful. °· Do not smoke. °· Get a humidifier to keep the air in your house free of odors. °· Get plenty of fresh air. °Contact a health care provider if: °· Your home remedies are not working, and you need medicine. °· You feel dizzy or lightheaded. °· You are losing weight. °Get help right away if: °· You have persistent and uncontrolled nausea and vomiting. °· You pass out (faint). °This information is not intended to replace advice given to you by your health care provider. Make sure you discuss any questions you have with your health care provider. °Document Released: 11/21/2006 Document Revised: 03/07/2016 Document Reviewed: 03/17/2013 °Elsevier Interactive Patient Education © 2017 Elsevier Inc. ° °

## 2017-03-20 ENCOUNTER — Ambulatory Visit (HOSPITAL_COMMUNITY)
Admission: EM | Admit: 2017-03-20 | Discharge: 2017-03-20 | Disposition: A | Discharge status: Home / Self Care | Payer: Medicaid Other | Attending: Internal Medicine | Admitting: Internal Medicine

## 2017-03-20 ENCOUNTER — Encounter (HOSPITAL_COMMUNITY): Payer: Self-pay | Admitting: Emergency Medicine

## 2017-03-20 DIAGNOSIS — L089 Local infection of the skin and subcutaneous tissue, unspecified: Secondary | ICD-10-CM | POA: Diagnosis not present

## 2017-03-20 MED ORDER — CEPHALEXIN 500 MG PO CAPS: 500.0000 mg | ORAL_CAPSULE | Freq: Four times a day (QID) | ORAL | 0 refills | Status: DC

## 2017-03-20 MED ORDER — ACETAMINOPHEN 325 MG PO TABS
650.0000 mg | ORAL_TABLET | Freq: Once | ORAL | Status: AC
  Administered 2017-03-20: 650 mg via ORAL

## 2017-03-20 MED ORDER — ACETAMINOPHEN 325 MG PO TABS
ORAL_TABLET | ORAL | Status: AC
  Filled 2017-03-20: qty 1

## 2017-03-20 NOTE — ED Notes (Signed)
Bed: UC06 Expected date:  Expected time:  Means of arrival:  Comments: 

## 2017-03-20 NOTE — ED Triage Notes (Addendum)
Finger started hurting 2 days ago. Right ring finger tip is swollen and patient reports this is very painful  [facility]

## 2017-03-20 NOTE — Discharge Instructions (Signed)
Take the antibiotic as directed. Warm water soaks or warm compresses up to 3 times a day. If he develops pus collection along the side of the nail then return to have it opened and drained. If the infection tends to go for the pad of the finger, turns red and swollen than that would be a trip to the emergency department.

## 2017-03-20 NOTE — ED Provider Notes (Signed)
CSN: [id number]     Arrival date & time 03/20/17  1353 History   First MD Initiated Contact with Patient 03/20/17 1452     Chief Complaint  Patient presents with  . Finger Injury   (Consider location/radiation/quality/duration/timing/severity/associated sxs/prior Treatment) 31 year old female states she is pregnant is complaining of pain to the right fourth digit distal phalanx around the nail. She states approximately 2 days ago she develops minor swelling and erythema along the edge of the nail on her aspect. There is currently erythema and tenderness. It does not extend to the joint. It does not involve the joint. There is currently no evidence of a purulent paronychia.      Past Medical History:  Diagnosis Date  . Medical history non-contributory    Past Surgical History:  Procedure Laterality Date  . INDUCED ABORTION    . VAGINAL DELIVERY     No family history on file. Social History  Substance Use Topics  . Smoking status: Never Smoker  . Smokeless tobacco: Never Used  . Alcohol use Yes     Comment: 1-2 X per week   OB History    Gravida Para Term Preterm AB Living   3 1 1   1 1    SAB TAB Ectopic Multiple Live Births     1           Review of Systems  Constitutional: Negative.   Respiratory: Negative.   Skin:       As per history of present illness  Neurological: Negative.   All other systems reviewed and are negative.   Allergies  Other and Mushroom extract complex  Home Medications   Prior to Admission medications   Medication Sig Start Date End Date Taking? Authorizing Provider  Doxylamine Succinate, Sleep, (UNISOM PO) Take by mouth.   Yes [provider]  cephALEXin (KEFLEX) 500 MG capsule Take 1 capsule (500 mg total) by mouth 4 (four) times daily. 03/20/17   Hayden RasmussenMabe, Zela Sobieski, NP   Meds Ordered and Administered this Visit  Medications - No data to display  BP 93/60 (BP Location: Left Arm)   Pulse 76   Temp 98.5 F (36.9 C) (Oral)   Resp 18    LMP 12/26/2016   SpO2 98%  No data found.   Physical Exam  Constitutional: She is oriented to person, place, and time. She appears well-developed and well-nourished. No distress.  Pulmonary/Chest: Breath sounds normal.  Musculoskeletal: Normal range of motion.  Neurological: She is alert and oriented to person, place, and time.  Skin: Skin is warm and dry.  Current tenderness, erythema and minor swelling to the ulnar aspect of the right ring finger adjacent to the nail. It does not extend to the flexor surface. There is no evidence of purulence or paronychia at this time. No evidence of a felon. No evidence of a wound, puncture mark or other integument injury.  Psychiatric: She has a normal mood and affect.  Nursing note and vitals reviewed.   Urgent Care Course     Procedures (including critical care time)  Labs Review Labs Reviewed - No data to display  Imaging Review No results found.   Visual Acuity Review  Right Eye Distance:   Left Eye Distance:   Bilateral Distance:    Right Eye Near:   Left Eye Near:    Bilateral Near:         MDM   1. Finger infection    This may be an early paronychia or  may develop into a felon. This was explained to the patient. Take the antibiotic as directed. Warm water soaks or warm compresses up to 3 times a day. If he develops pus collection along the side of the nail then return to have it opened and drained. If the infection tends to go for the pad of the finger, turns red and swollen than that would be a trip to the emergency department. Meds ordered this encounter  Medications  . Doxylamine Succinate, Sleep, (UNISOM PO)    Sig: Take by mouth.  . cephALEXin (KEFLEX) 500 MG capsule    Sig: Take 1 capsule (500 mg total) by mouth 4 (four) times daily.    Dispense:  28 capsule    Refill:  0    Order Specific Question:   Supervising Provider    Answer:   Eustace Moore [[id number]]   APAP 650 mg po now, just before D/C.     Hayden Rasmussen, NP 03/20/17 1512    Hayden Rasmussen, NP 03/20/17 1514

## 2017-05-20 ENCOUNTER — Other Ambulatory Visit (HOSPITAL_COMMUNITY): Payer: Self-pay | Admitting: Nurse Practitioner

## 2017-05-20 DIAGNOSIS — Z36 Encounter for antenatal screening for chromosomal anomalies: Secondary | ICD-10-CM

## 2017-05-27 ENCOUNTER — Ambulatory Visit (HOSPITAL_COMMUNITY)
Admission: RE | Admit: 2017-05-27 | Discharge: 2017-05-27 | Disposition: A | Discharge status: Home / Self Care | Payer: Medicaid Other | Source: Ambulatory Visit | Attending: Nurse Practitioner | Admitting: Nurse Practitioner

## 2017-05-27 ENCOUNTER — Ambulatory Visit (HOSPITAL_COMMUNITY): Payer: Medicaid Other

## 2017-05-27 DIAGNOSIS — Z3A19 19 weeks gestation of pregnancy: Secondary | ICD-10-CM | POA: Diagnosis not present

## 2017-05-27 DIAGNOSIS — Z3689 Encounter for other specified antenatal screening: Secondary | ICD-10-CM | POA: Insufficient documentation

## 2017-05-27 DIAGNOSIS — Z36 Encounter for antenatal screening for chromosomal anomalies: Secondary | ICD-10-CM

## 2017-07-22 ENCOUNTER — Encounter (HOSPITAL_COMMUNITY): Payer: Self-pay | Admitting: *Deleted

## 2017-07-22 ENCOUNTER — Inpatient Hospital Stay (HOSPITAL_COMMUNITY)
Admission: AD | Admit: 2017-07-22 | Discharge: 2017-07-22 | Disposition: A | Discharge status: Home / Self Care | Payer: Medicaid Other | Source: Ambulatory Visit | Attending: Family Medicine | Admitting: Family Medicine

## 2017-07-22 DIAGNOSIS — O99619 Diseases of the digestive system complicating pregnancy, unspecified trimester: Secondary | ICD-10-CM

## 2017-07-22 DIAGNOSIS — K219 Gastro-esophageal reflux disease without esophagitis: Secondary | ICD-10-CM | POA: Insufficient documentation

## 2017-07-22 DIAGNOSIS — Z3A29 29 weeks gestation of pregnancy: Secondary | ICD-10-CM | POA: Insufficient documentation

## 2017-07-22 DIAGNOSIS — R51 Headache: Secondary | ICD-10-CM | POA: Insufficient documentation

## 2017-07-22 DIAGNOSIS — R519 Headache, unspecified: Secondary | ICD-10-CM

## 2017-07-22 DIAGNOSIS — O26893 Other specified pregnancy related conditions, third trimester: Secondary | ICD-10-CM | POA: Diagnosis not present

## 2017-07-22 DIAGNOSIS — O99613 Diseases of the digestive system complicating pregnancy, third trimester: Secondary | ICD-10-CM | POA: Insufficient documentation

## 2017-07-22 DIAGNOSIS — W57XXXA Bitten or stung by nonvenomous insect and other nonvenomous arthropods, initial encounter: Secondary | ICD-10-CM | POA: Insufficient documentation

## 2017-07-22 LAB — URINALYSIS, ROUTINE W REFLEX MICROSCOPIC
BILIRUBIN URINE: NEGATIVE
Glucose, UA: NEGATIVE mg/dL
HGB URINE DIPSTICK: NEGATIVE
Ketones, ur: NEGATIVE mg/dL
Leukocytes, UA: NEGATIVE
Nitrite: NEGATIVE
PH: 6 (ref 5.0–8.0)
Protein, ur: NEGATIVE mg/dL
SPECIFIC GRAVITY, URINE: 1.021 (ref 1.005–1.030)

## 2017-07-22 NOTE — MAU Provider Note (Signed)
Chief Complaint:  Insect Bite and Headache   First Provider Initiated Contact with Patient 07/22/17 1936      HPI: Margaret Spence is a 31 y.o. G3P1011 at [redacted]w[redacted]d who presents to MAU reporting with various complaints. #Headache: occurring for the last two months. Happen almost daily. Started after she switched PNV. Headache improved today after drinking caffeine. Rates pain currently 0/10. Does not take medications for headache. #Heartburn: worsening as pregnancy progresses. Not taking any medications.  #Bug bites: noted 4 bug bites on ankles since Sunday. Bites are itchy and red. Was out of town over the weekend. No one else in household has similar bites. Concerned they could be spider bites.   Denies contractions, leakage of fluid, vaginal discharge, dysuria, or vaginal bleeding. Good fetal movement.   Pregnancy Course:   Past Medical History: Past Medical History:  Diagnosis Date  . Medical history non-contributory     Past obstetric history: OB History  Gravida Para Term Preterm AB Living  SAB TAB Ectopic Multiple Live Births    1          # Outcome Date GA Lbr Len/2nd Weight Sex Delivery Anes PTL Lv  3 Current           2 TAB           1 Term               Past Surgical History: Past Surgical History:  Procedure Laterality Date  . INDUCED ABORTION    . VAGINAL DELIVERY       Family History: History reviewed. No pertinent family history.  Social History: Social History  Substance Use Topics  . Smoking status: Never Smoker  . Smokeless tobacco: Never Used  . Alcohol use Yes     Comment: 1-2 X per week    Allergies:  Allergies  Allergen Reactions  . Other     Tree nuts - rash  . Mushroom Extract Complex Rash    Meds:  Prescriptions Prior to Admission  Medication Sig Dispense Refill Last Dose  . cephALEXin (KEFLEX) 500 MG capsule Take 1 capsule (500 mg total) by mouth 4 (four) times daily. 28 capsule 0   . Doxylamine Succinate,  Sleep, (UNISOM PO) Take by mouth.   03/19/2017 at Unknown time    I have reviewed patient's Past Medical Hx, Surgical Hx, Family Hx, Social Hx, medications and allergies.   ROS:  All systems reviewed and are negative for acute change except as noted in the HPI.   Physical Exam  Patient Vitals for the past 24 hrs:  BP Temp Temp src Pulse Resp SpO2 Height Weight  07/22/17 1916 115/69 97.9 F (36.6 C) Oral 83 19 100 % 5' 5.5" (1.664 m) 74.8 kg (165 lb)   Constitutional: Well-developed, well-nourished female in no acute distress.  Cardiovascular: normal rate and rhythm, pulses intact Respiratory: normal rate and effort.  GI: Abd soft, non-tender, gravid appropriate for gestational age.  MS: Extremities nontender, trace edema, normal ROM, 3 1cm circular erythematous bumps on right medial ankle and one on left lateral ankle Neurologic: Alert and oriented x 4.  Psych: normal mood and affect  Labs: Results for orders placed or performed during the hospital encounter of 07/22/17 (from the past 24 hour(s))  Urinalysis, Routine w reflex microscopic     Status: None   Collection Time: 07/22/17  7:15 PM  Result Value Ref Range   Color, Urine YELLOW  YELLOW   APPearance CLEAR CLEAR   Specific Gravity, Urine 1.021 1.005 - 1.030   pH 6.0 5.0 - 8.0   Glucose, UA NEGATIVE NEGATIVE mg/dL   Hgb urine dipstick NEGATIVE NEGATIVE   Bilirubin Urine NEGATIVE NEGATIVE   Ketones, ur NEGATIVE NEGATIVE mg/dL   Protein, ur NEGATIVE NEGATIVE mg/dL   Nitrite NEGATIVE NEGATIVE   Leukocytes, UA NEGATIVE NEGATIVE    Imaging:  No results found.  MAU Course: Vitals and nursing notes reviewed UA clean Bites appear to be mosquito bites on bilateral ankles  I personally reviewed the patient's NST today, found to be REACTIVE. 150 bpm, mod var, +accels, no decels. CTX: none.   MDM: Plan of care reviewed with patient, including labs and tests ordered and medical treatment.   Assessment: 1. Nonintractable  episodic headache, unspecified headache type   2. Gastroesophageal reflux in pregnancy   3. Bug bite, initial encounter     Plan: Discharge home in stable condition.  Medications safe in pregnancy given to patient Handout given Follow-up with OB provider   Caryl Ada, DO [facility], [facility] 07/22/2017 7:37 PM

## 2017-07-22 NOTE — Discharge Instructions (Signed)

## 2017-07-22 NOTE — MAU Note (Signed)
Pt reports HA intermittently that started when she switched PNV's. Pt reports heartburn that has gotten progressively worse throughout the pregnancy. Also has "bug bites" that she noticed Sunday. Pt reports not having taken any medication for HA or heartburn.

## 2017-09-18 LAB — OB RESULTS CONSOLE GC/CHLAMYDIA
CHLAMYDIA, DNA PROBE: NEGATIVE
Gonorrhea: NEGATIVE

## 2017-09-18 LAB — OB RESULTS CONSOLE GBS: STREP GROUP B AG: POSITIVE

## 2017-10-10 ENCOUNTER — Inpatient Hospital Stay (HOSPITAL_COMMUNITY)
Admission: AD | Admit: 2017-10-10 | Discharge: 2017-10-11 | Disposition: A | Discharge status: Home / Self Care | Payer: Medicaid Other | Source: Ambulatory Visit | Attending: Obstetrics & Gynecology | Admitting: Obstetrics & Gynecology

## 2017-10-10 ENCOUNTER — Other Ambulatory Visit: Payer: Self-pay

## 2017-10-10 DIAGNOSIS — O471 False labor at or after 37 completed weeks of gestation: Secondary | ICD-10-CM | POA: Insufficient documentation

## 2017-10-10 DIAGNOSIS — Z833 Family history of diabetes mellitus: Secondary | ICD-10-CM | POA: Insufficient documentation

## 2017-10-10 DIAGNOSIS — O26893 Other specified pregnancy related conditions, third trimester: Secondary | ICD-10-CM | POA: Insufficient documentation

## 2017-10-10 DIAGNOSIS — Z8249 Family history of ischemic heart disease and other diseases of the circulatory system: Secondary | ICD-10-CM | POA: Insufficient documentation

## 2017-10-10 DIAGNOSIS — N898 Other specified noninflammatory disorders of vagina: Secondary | ICD-10-CM | POA: Insufficient documentation

## 2017-10-10 DIAGNOSIS — Z3A39 39 weeks gestation of pregnancy: Secondary | ICD-10-CM | POA: Insufficient documentation

## 2017-10-10 DIAGNOSIS — O479 False labor, unspecified: Secondary | ICD-10-CM

## 2017-10-10 NOTE — MAU Note (Signed)
Threw up about 1930 and then had leaking of fld down legs. Cont to leak alittle for short time and none since. Some ctxs. Fld was clear

## 2017-10-11 ENCOUNTER — Encounter (HOSPITAL_COMMUNITY): Payer: Self-pay

## 2017-10-11 DIAGNOSIS — O471 False labor at or after 37 completed weeks of gestation: Secondary | ICD-10-CM | POA: Diagnosis not present

## 2017-10-11 DIAGNOSIS — Z3A39 39 weeks gestation of pregnancy: Secondary | ICD-10-CM | POA: Diagnosis not present

## 2017-10-11 DIAGNOSIS — N898 Other specified noninflammatory disorders of vagina: Secondary | ICD-10-CM | POA: Diagnosis present

## 2017-10-11 DIAGNOSIS — O26893 Other specified pregnancy related conditions, third trimester: Secondary | ICD-10-CM | POA: Diagnosis not present

## 2017-10-11 DIAGNOSIS — Z8249 Family history of ischemic heart disease and other diseases of the circulatory system: Secondary | ICD-10-CM | POA: Diagnosis not present

## 2017-10-11 DIAGNOSIS — Z833 Family history of diabetes mellitus: Secondary | ICD-10-CM | POA: Diagnosis not present

## 2017-10-11 LAB — POCT FERN TEST
POCT FERN TEST: NEGATIVE
POCT Fern Test: NEGATIVE

## 2017-10-11 LAB — WET PREP, GENITAL
SPERM: NONE SEEN
TRICH WET PREP: NONE SEEN
Yeast Wet Prep HPF POC: NONE SEEN

## 2017-10-11 NOTE — MAU Provider Note (Signed)
History  CSN: [id number] Arrival date and time: 10/10/17 2343  None     Chief Complaint  Patient presents with  . Contractions  . Rupture of Membranes    HPI: Margaret Spence is a 31 y.o. G3P1011 with IUP at 5035w1d who presents to maternity admissions reporting ?LOF tonight. Reports she has a bout of cough and threw up around 19:30 at which time she noticed small amount of fluid down her leg. Could not tell if urine or amniotic fluid. Has not had additional LOF since. She is having some contractions, but reports theses are not strong. Denies vaginal bleeding or other vaginal discharge or odor. Reports good fetal movement.    OB History  Gravida Para Term Preterm AB Living  3 1 1   1 1   SAB TAB Ectopic Multiple Live Births    1          # Outcome Date GA Lbr Len/2nd Weight Sex Delivery Anes PTL Lv  3 Current           2 TAB           1 Term              Past Medical History:  Diagnosis Date  . Medical history non-contributory    Past Surgical History:  Procedure Laterality Date  . INDUCED ABORTION    . VAGINAL DELIVERY     Family History  Problem Relation Age of Onset  . Diabetes Mother   . Hypertension Mother    Social History   Socioeconomic History  . Marital status: Single    Spouse name: Not on file  . Number of children: Not on file  . Years of education: Not on file  . Highest education level: Not on file  Social Needs  . Financial resource strain: Not on file  . Food insecurity - worry: Not on file  . Food insecurity - inability: Not on file  . Transportation needs - medical: Not on file  . Transportation needs - non-medical: Not on file  Occupational History  . Not on file  Tobacco Use  . Smoking status: Never Smoker  . Smokeless tobacco: Never Used  Substance and Sexual Activity  . Alcohol use: Yes    Comment: 1-2 X per week- not with preg  . Drug use: Yes    Types: Marijuana    Comment: at least 1 X/day  . Sexual activity: Yes     Birth control/protection: None  Other Topics Concern  . Not on file  Social History Narrative  . Not on file   Allergies  Allergen Reactions  . Other     Tree nuts - rash  . Mushroom Extract Complex Rash    Medications Prior to Admission  Medication Sig Dispense Refill Last Dose  . Prenatal Vit-Fe Fumarate-FA (PRENATAL MULTIVITAMIN) TABS tablet Take 1 tablet by mouth daily at 12 noon.   10/10/2017 at Unknown time  . ranitidine (ZANTAC) 150 MG tablet Take 150 mg by mouth 2 (two) times daily.   10/10/2017 at Unknown time  . cephALEXin (KEFLEX) 500 MG capsule Take 1 capsule (500 mg total) by mouth 4 (four) times daily. (Patient not taking: Reported on 07/22/2017) 28 capsule 0 Not Taking at Unknown time    I have reviewed patient's Past Medical Hx, Surgical Hx, Family Hx, Social Hx, medications and allergies.   Review of Systems: Negative except for what is mentioned in HPI.  Physical Exam   Blood pressure  102/69, pulse (!) 112, temperature 98.1 F (36.7 C), temperature source Oral, resp. rate 18, height 5\' 5"  (1.651 m), weight 180 lb (81.6 kg), last menstrual period 12/26/2016.  Constitutional: Well-developed, well-nourished female in no acute distress.  HENT: [place]/AT Eyes: normal conjunctivae, no scleral icterus Respiratory: normal effort GI: Abd soft, non-tender, gravid appropriate for gestational age.   Pelvic: NEFG. Normal vaginal mucosa without lesions with moderate amount of whitish discharge. No pooling of fluid. Cervix visually about 0.5-1cm dilated. No LOF with cough or bearing down.  SVE: 1cm/thick/high MSK: Extremities nontender, no edema Neurologic: Alert and oriented x 4. Psych: Normal mood and affect Skin: warm and dry   FHT:  Baseline 145 , moderate variability, accelerations present, no decelerations Toco: ctx q4-5 min, pt comfortable and talking trough them  MAU Course/MDM:   Nursing notes and VS reviewed. Patient seen and examined, as noted above.    Negative ferning, negative pooling. Thin vaginal discharge, wet prep sent   Assessment and Plan  Assessment: 1. Braxton Hick's contraction   2. False labor   3. Vaginal discharge     Plan: --Discharge home in stable condition.  --Labor precautions, ROM precautions and fetal kick counts   Degele, Kandra NicolasJulie P, MD 10/11/2017 1:16 AM

## 2017-10-11 NOTE — Discharge Instructions (Signed)
Braxton Hicks Contractions °Contractions of the uterus can occur throughout pregnancy, but they are not always a sign that you are in labor. You may have practice contractions called Braxton Hicks contractions. These false labor contractions are sometimes confused with true labor. °What are Braxton Hicks contractions? °Braxton Hicks contractions are tightening movements that occur in the muscles of the uterus before labor. Unlike true labor contractions, these contractions do not result in opening (dilation) and thinning of the cervix. Toward the end of pregnancy (32-34 weeks), Braxton Hicks contractions can happen more often and may become stronger. These contractions are sometimes difficult to tell apart from true labor because they can be very uncomfortable. You should not feel embarrassed if you go to the hospital with false labor. °Sometimes, the only way to tell if you are in true labor is for your health care provider to look for changes in the cervix. The health care provider will do a physical exam and may monitor your contractions. If you are not in true labor, the exam should show that your cervix is not dilating and your water has not broken. °If there are other health problems associated with your pregnancy, it is completely safe for you to be sent home with false labor. You may continue to have Braxton Hicks contractions until you go into true labor. °How to tell the difference between true labor and false labor °True labor °· Contractions last 30-70 seconds. °· Contractions become very regular. °· Discomfort is usually felt in the top of the uterus, and it spreads to the lower abdomen and low back. °· Contractions do not go away with walking. °· Contractions usually become more intense and increase in frequency. °· The cervix dilates and gets thinner. °False labor °· Contractions are usually shorter and not as strong as true labor contractions. °· Contractions are usually irregular. °· Contractions  are often felt in the front of the lower abdomen and in the groin. °· Contractions may go away when you walk around or change positions while lying down. °· Contractions get weaker and are shorter-lasting as time goes on. °· The cervix usually does not dilate or become thin. °Follow these instructions at home: °· Take over-the-counter and prescription medicines only as told by your health care provider. °· Keep up with your usual exercises and follow other instructions from your health care provider. °· Eat and drink lightly if you think you are going into labor. °· If Braxton Hicks contractions are making you uncomfortable: °? Change your position from lying down or resting to walking, or change from walking to resting. °? Sit and rest in a tub of warm water. °? Drink enough fluid to keep your urine pale yellow. Dehydration may cause these contractions. °? Do slow and deep breathing several times an hour. °· Keep all follow-up prenatal visits as told by your health care provider. This is important. °Contact a health care provider if: °· You have a fever. °· You have continuous pain in your abdomen. °Get help right away if: °· Your contractions become stronger, more regular, and closer together. °· You have fluid leaking or gushing from your vagina. °· You pass blood-tinged mucus (bloody show). °· You have bleeding from your vagina. °· You have low back pain that you never had before. °· You feel your baby’s head pushing down and causing pelvic pressure. °· Your baby is not moving inside you as much as it used to. °Summary °· Contractions that occur before labor are called Braxton   Hicks contractions, false labor, or practice contractions. °· Braxton Hicks contractions are usually shorter, weaker, farther apart, and less regular than true labor contractions. True labor contractions usually become progressively stronger and regular and they become more frequent. °· Manage discomfort from Braxton Hicks contractions by  changing position, resting in a warm bath, drinking plenty of water, or practicing deep breathing. °This information is not intended to replace advice given to you by your health care provider. Make sure you discuss any questions you have with your health care provider. °Document Released: 02/13/2017 Document Revised: 02/13/2017 Document Reviewed: 02/13/2017 °Elsevier Interactive Patient Education © 2018 [employer] ° °

## 2017-10-14 NOTE — L&D Delivery Note (Signed)
Delivery Note At 10:47 PM a viable female was delivered via Vaginal, Spontaneous (Presentation: LOP;  ).  APGAR: 8, 9; weight  pending.   Placenta status: schultz, complete/intact .  Cord: nuchal x 1  with the following complications: none .  Cord pH: NA  Anesthesia:  local Episiotomy: None Lacerations:  1st degree periurethral, hemostatic, no repair. 2nd degree perineal with repair.  Suture Repair: 3.0 monocryl  Est. Blood Loss (mL):    Mom to postpartum.  Baby to Couplet care / Skin to Skin.  Thressa ShellerHeather Aksh Swart 10/21/2017, 11:09 PM

## 2017-10-15 ENCOUNTER — Encounter (HOSPITAL_COMMUNITY): Payer: Self-pay | Admitting: *Deleted

## 2017-10-15 ENCOUNTER — Inpatient Hospital Stay (HOSPITAL_COMMUNITY)
Admission: AD | Admit: 2017-10-15 | Discharge: 2017-10-15 | Disposition: A | Discharge status: Home / Self Care | Payer: Medicaid Other | Source: Ambulatory Visit | Attending: Obstetrics & Gynecology | Admitting: Obstetrics & Gynecology

## 2017-10-15 ENCOUNTER — Other Ambulatory Visit: Payer: Self-pay

## 2017-10-15 DIAGNOSIS — O471 False labor at or after 37 completed weeks of gestation: Secondary | ICD-10-CM

## 2017-10-15 DIAGNOSIS — Z3A39 39 weeks gestation of pregnancy: Secondary | ICD-10-CM | POA: Diagnosis not present

## 2017-10-15 NOTE — Discharge Instructions (Signed)
Braxton Hicks Contractions °Contractions of the uterus can occur throughout pregnancy, but they are not always a sign that you are in labor. You may have practice contractions called Braxton Hicks contractions. These false labor contractions are sometimes confused with true labor. °What are Braxton Hicks contractions? °Braxton Hicks contractions are tightening movements that occur in the muscles of the uterus before labor. Unlike true labor contractions, these contractions do not result in opening (dilation) and thinning of the cervix. Toward the end of pregnancy (32-34 weeks), Braxton Hicks contractions can happen more often and may become stronger. These contractions are sometimes difficult to tell apart from true labor because they can be very uncomfortable. You should not feel embarrassed if you go to the hospital with false labor. °Sometimes, the only way to tell if you are in true labor is for your health care provider to look for changes in the cervix. The health care provider will do a physical exam and may monitor your contractions. If you are not in true labor, the exam should show that your cervix is not dilating and your water has not broken. °If there are other health problems associated with your pregnancy, it is completely safe for you to be sent home with false labor. You may continue to have Braxton Hicks contractions until you go into true labor. °How to tell the difference between true labor and false labor °True labor °· Contractions last 30-70 seconds. °· Contractions become very regular. °· Discomfort is usually felt in the top of the uterus, and it spreads to the lower abdomen and low back. °· Contractions do not go away with walking. °· Contractions usually become more intense and increase in frequency. °· The cervix dilates and gets thinner. °False labor °· Contractions are usually shorter and not as strong as true labor contractions. °· Contractions are usually irregular. °· Contractions  are often felt in the front of the lower abdomen and in the groin. °· Contractions may go away when you walk around or change positions while lying down. °· Contractions get weaker and are shorter-lasting as time goes on. °· The cervix usually does not dilate or become thin. °Follow these instructions at home: °· Take over-the-counter and prescription medicines only as told by your health care provider. °· Keep up with your usual exercises and follow other instructions from your health care provider. °· Eat and drink lightly if you think you are going into labor. °· If Braxton Hicks contractions are making you uncomfortable: °? Change your position from lying down or resting to walking, or change from walking to resting. °? Sit and rest in a tub of warm water. °? Drink enough fluid to keep your urine pale yellow. Dehydration may cause these contractions. °? Do slow and deep breathing several times an hour. °· Keep all follow-up prenatal visits as told by your health care provider. This is important. °Contact a health care provider if: °· You have a fever. °· You have continuous pain in your abdomen. °Get help right away if: °· Your contractions become stronger, more regular, and closer together. °· You have fluid leaking or gushing from your vagina. °· You have bleeding from your vagina. °· You have low back pain that you never had before. °· You feel your baby’s head pushing down and causing pelvic pressure. °· Your baby is not moving inside you as much as it used to. °Summary °· Contractions that occur before labor are called Braxton Hicks contractions, false labor, or practice contractions. °·   Braxton Hicks contractions are usually shorter, weaker, farther apart, and less regular than true labor contractions. True labor contractions usually become progressively stronger and regular and they become more frequent. °· Manage discomfort from Braxton Hicks contractions by changing position, resting in a warm bath,  drinking plenty of water, or practicing deep breathing. °This information is not intended to replace advice given to you by your health care provider. Make sure you discuss any questions you have with your health care provider. °Document Released: 02/13/2017 Document Revised: 02/13/2017 Document Reviewed: 02/13/2017 °Elsevier Interactive Patient Education © 2018 [employer]vier Inc. ° °

## 2017-10-15 NOTE — MAU Note (Signed)
Woke up this morning and saw blood when she wiped. Noted some spotting in underwear.  Is due in a couple days.  No heavy contractions, some pressure. No pain.  No placental issues with preg.

## 2017-10-17 ENCOUNTER — Telehealth (HOSPITAL_COMMUNITY): Payer: Self-pay | Admitting: *Deleted

## 2017-10-17 ENCOUNTER — Encounter (HOSPITAL_COMMUNITY): Payer: Self-pay | Admitting: *Deleted

## 2017-10-17 NOTE — Telephone Encounter (Signed)
Preadmission screen  

## 2017-10-18 ENCOUNTER — Encounter (HOSPITAL_COMMUNITY): Payer: Self-pay

## 2017-10-18 ENCOUNTER — Inpatient Hospital Stay (HOSPITAL_COMMUNITY)
Admission: AD | Admit: 2017-10-18 | Discharge: 2017-10-18 | Disposition: A | Discharge status: Home / Self Care | Payer: Medicaid Other | Source: Ambulatory Visit | Attending: Obstetrics and Gynecology | Admitting: Obstetrics and Gynecology

## 2017-10-18 DIAGNOSIS — Z3A40 40 weeks gestation of pregnancy: Secondary | ICD-10-CM | POA: Insufficient documentation

## 2017-10-18 DIAGNOSIS — O471 False labor at or after 37 completed weeks of gestation: Secondary | ICD-10-CM | POA: Diagnosis present

## 2017-10-18 NOTE — Discharge Instructions (Signed)
Braxton Hicks Contractions °Contractions of the uterus can occur throughout pregnancy, but they are not always a sign that you are in labor. You may have practice contractions called Braxton Hicks contractions. These false labor contractions are sometimes confused with true labor. °What are Braxton Hicks contractions? °Braxton Hicks contractions are tightening movements that occur in the muscles of the uterus before labor. Unlike true labor contractions, these contractions do not result in opening (dilation) and thinning of the cervix. Toward the end of pregnancy (32-34 weeks), Braxton Hicks contractions can happen more often and may become stronger. These contractions are sometimes difficult to tell apart from true labor because they can be very uncomfortable. You should not feel embarrassed if you go to the hospital with false labor. °Sometimes, the only way to tell if you are in true labor is for your health care provider to look for changes in the cervix. The health care provider will do a physical exam and may monitor your contractions. If you are not in true labor, the exam should show that your cervix is not dilating and your water has not broken. °If there are other health problems associated with your pregnancy, it is completely safe for you to be sent home with false labor. You may continue to have Braxton Hicks contractions until you go into true labor. °How to tell the difference between true labor and false labor °True labor °· Contractions last 30-70 seconds. °· Contractions become very regular. °· Discomfort is usually felt in the top of the uterus, and it spreads to the lower abdomen and low back. °· Contractions do not go away with walking. °· Contractions usually become more intense and increase in frequency. °· The cervix dilates and gets thinner. °False labor °· Contractions are usually shorter and not as strong as true labor contractions. °· Contractions are usually irregular. °· Contractions  are often felt in the front of the lower abdomen and in the groin. °· Contractions may go away when you walk around or change positions while lying down. °· Contractions get weaker and are shorter-lasting as time goes on. °· The cervix usually does not dilate or become thin. °Follow these instructions at home: °· Take over-the-counter and prescription medicines only as told by your health care provider. °· Keep up with your usual exercises and follow other instructions from your health care provider. °· Eat and drink lightly if you think you are going into labor. °· If Braxton Hicks contractions are making you uncomfortable: °? Change your position from lying down or resting to walking, or change from walking to resting. °? Sit and rest in a tub of warm water. °? Drink enough fluid to keep your urine pale yellow. Dehydration may cause these contractions. °? Do slow and deep breathing several times an hour. °· Keep all follow-up prenatal visits as told by your health care provider. This is important. °Contact a health care provider if: °· You have a fever. °· You have continuous pain in your abdomen. °Get help right away if: °· Your contractions become stronger, more regular, and closer together. °· You have fluid leaking or gushing from your vagina. °· You pass blood-tinged mucus (bloody show). °· You have bleeding from your vagina. °· You have low back pain that you never had before. °· You feel your baby’s head pushing down and causing pelvic pressure. °· Your baby is not moving inside you as much as it used to. °Summary °· Contractions that occur before labor are called Braxton   Hicks contractions, false labor, or practice contractions. °· Braxton Hicks contractions are usually shorter, weaker, farther apart, and less regular than true labor contractions. True labor contractions usually become progressively stronger and regular and they become more frequent. °· Manage discomfort from Braxton Hicks contractions by  changing position, resting in a warm bath, drinking plenty of water, or practicing deep breathing. °This information is not intended to replace advice given to you by your health care provider. Make sure you discuss any questions you have with your health care provider. °Document Released: 02/13/2017 Document Revised: 02/13/2017 Document Reviewed: 02/13/2017 °Elsevier Interactive Patient Education © 2018 [employer] ° °

## 2017-10-18 NOTE — Progress Notes (Addendum)
G2P1 @ 40.[redacted] wksga. Presents to triage for ctx that started last Friday. Now ctx q7 mins. Denies lof or bleeding. + FM. EFM applied  SVE: 1.5/40/-2  D/C instructions received.  2123: D/c instructions given with pt understanding.

## 2017-10-19 ENCOUNTER — Other Ambulatory Visit: Payer: Self-pay | Admitting: Obstetrics and Gynecology

## 2017-10-21 ENCOUNTER — Inpatient Hospital Stay (HOSPITAL_COMMUNITY)
Admission: AD | Admit: 2017-10-21 | Discharge: 2017-10-23 | DRG: 807 | Disposition: A | Discharge status: Home / Self Care | Payer: Medicaid Other | Source: Ambulatory Visit | Attending: Family Medicine | Admitting: Family Medicine

## 2017-10-21 ENCOUNTER — Encounter (HOSPITAL_COMMUNITY): Payer: Self-pay | Admitting: *Deleted

## 2017-10-21 ENCOUNTER — Encounter (HOSPITAL_COMMUNITY): Payer: Self-pay | Admitting: Anesthesiology

## 2017-10-21 DIAGNOSIS — O99892 Other specified diseases and conditions complicating childbirth: Secondary | ICD-10-CM

## 2017-10-21 DIAGNOSIS — O99324 Drug use complicating childbirth: Secondary | ICD-10-CM | POA: Diagnosis present

## 2017-10-21 DIAGNOSIS — O26899 Other specified pregnancy related conditions, unspecified trimester: Secondary | ICD-10-CM

## 2017-10-21 DIAGNOSIS — Z23 Encounter for immunization: Secondary | ICD-10-CM | POA: Diagnosis not present

## 2017-10-21 DIAGNOSIS — Z3A40 40 weeks gestation of pregnancy: Secondary | ICD-10-CM | POA: Diagnosis not present

## 2017-10-21 DIAGNOSIS — F1290 Cannabis use, unspecified, uncomplicated: Secondary | ICD-10-CM | POA: Diagnosis present

## 2017-10-21 DIAGNOSIS — O26893 Other specified pregnancy related conditions, third trimester: Secondary | ICD-10-CM | POA: Diagnosis present

## 2017-10-21 DIAGNOSIS — Z6791 Unspecified blood type, Rh negative: Secondary | ICD-10-CM | POA: Diagnosis not present

## 2017-10-21 DIAGNOSIS — Z283 Underimmunization status: Secondary | ICD-10-CM

## 2017-10-21 DIAGNOSIS — O99824 Streptococcus B carrier state complicating childbirth: Secondary | ICD-10-CM | POA: Diagnosis present

## 2017-10-21 DIAGNOSIS — Z3483 Encounter for supervision of other normal pregnancy, third trimester: Secondary | ICD-10-CM | POA: Diagnosis present

## 2017-10-21 DIAGNOSIS — O9989 Other specified diseases and conditions complicating pregnancy, childbirth and the puerperium: Secondary | ICD-10-CM

## 2017-10-21 LAB — CBC
HCT: 34.5 % — ABNORMAL LOW (ref 36.0–46.0)
Hemoglobin: 11.5 g/dL — ABNORMAL LOW (ref 12.0–15.0)
MCH: 30.7 pg (ref 26.0–34.0)
MCHC: 33.3 g/dL (ref 30.0–36.0)
MCV: 92 fL (ref 78.0–100.0)
PLATELETS: 294 10*3/uL (ref 150–400)
RBC: 3.75 MIL/uL — ABNORMAL LOW (ref 3.87–5.11)
RDW: 13.5 % (ref 11.5–15.5)
WBC: 9.5 10*3/uL (ref 4.0–10.5)

## 2017-10-21 MED ORDER — DIPHENHYDRAMINE HCL 50 MG/ML IJ SOLN: 12.5000 mg | INTRAMUSCULAR | Status: DC | PRN

## 2017-10-21 MED ORDER — FENTANYL CITRATE (PF) 100 MCG/2ML IJ SOLN
100.0000 ug | Freq: Once | INTRAMUSCULAR | Status: AC
  Administered 2017-10-21: 100 ug via INTRAMUSCULAR
  Filled 2017-10-21: qty 2

## 2017-10-21 MED ORDER — PHENYLEPHRINE 40 MCG/ML (10ML) SYRINGE FOR IV PUSH (FOR BLOOD PRESSURE SUPPORT)
PREFILLED_SYRINGE | INTRAVENOUS | Status: AC
  Filled 2017-10-21: qty 20

## 2017-10-21 MED ORDER — PENICILLIN G POT IN DEXTROSE 60000 UNIT/ML IV SOLN
3.0000 10*6.[IU] | INTRAVENOUS | Status: DC
  Administered 2017-10-21: 3 10*6.[IU] via INTRAVENOUS
  Filled 2017-10-21 (×5): qty 50

## 2017-10-21 MED ORDER — OXYTOCIN 40 UNITS IN LACTATED RINGERS INFUSION - SIMPLE MED
2.5000 [IU]/h | INTRAVENOUS | Status: DC
  Filled 2017-10-21: qty 1000

## 2017-10-21 MED ORDER — ONDANSETRON HCL 4 MG/2ML IJ SOLN: 4.0000 mg | Freq: Four times a day (QID) | INTRAMUSCULAR | Status: DC | PRN

## 2017-10-21 MED ORDER — OXYCODONE-ACETAMINOPHEN 5-325 MG PO TABS: 2.0000 | ORAL_TABLET | ORAL | Status: DC | PRN

## 2017-10-21 MED ORDER — FENTANYL 2.5 MCG/ML BUPIVACAINE 1/10 % EPIDURAL INFUSION (WH - ANES)
INTRAMUSCULAR | Status: AC
  Filled 2017-10-21: qty 100

## 2017-10-21 MED ORDER — PHENYLEPHRINE 40 MCG/ML (10ML) SYRINGE FOR IV PUSH (FOR BLOOD PRESSURE SUPPORT): 80.0000 ug | PREFILLED_SYRINGE | INTRAVENOUS | Status: DC | PRN

## 2017-10-21 MED ORDER — OXYTOCIN BOLUS FROM INFUSION
500.0000 mL | Freq: Once | INTRAVENOUS | Status: AC
  Administered 2017-10-21: 500 mL via INTRAVENOUS

## 2017-10-21 MED ORDER — EPHEDRINE 5 MG/ML INJ: 10.0000 mg | INTRAVENOUS | Status: DC | PRN

## 2017-10-21 MED ORDER — FLEET ENEMA 7-19 GM/118ML RE ENEM: 1.0000 | ENEMA | RECTAL | Status: DC | PRN

## 2017-10-21 MED ORDER — LACTATED RINGERS IV SOLN: 500.0000 mL | INTRAVENOUS | Status: DC | PRN

## 2017-10-21 MED ORDER — SOD CITRATE-CITRIC ACID 500-334 MG/5ML PO SOLN
30.0000 mL | ORAL | Status: DC | PRN
  Administered 2017-10-21: 30 mL via ORAL
  Filled 2017-10-21: qty 15

## 2017-10-21 MED ORDER — OXYCODONE-ACETAMINOPHEN 5-325 MG PO TABS
1.0000 | ORAL_TABLET | ORAL | Status: DC | PRN
  Administered 2017-10-21: 1 via ORAL
  Filled 2017-10-21: qty 1

## 2017-10-21 MED ORDER — LACTATED RINGERS IV SOLN: 500.0000 mL | Freq: Once | INTRAVENOUS | Status: DC

## 2017-10-21 MED ORDER — LACTATED RINGERS IV SOLN
INTRAVENOUS | Status: DC
  Administered 2017-10-21: 17:00:00 via INTRAVENOUS

## 2017-10-21 MED ORDER — FENTANYL 2.5 MCG/ML BUPIVACAINE 1/10 % EPIDURAL INFUSION (WH - ANES): 14.0000 mL/h | INTRAMUSCULAR | Status: DC | PRN

## 2017-10-21 MED ORDER — LIDOCAINE HCL (PF) 1 % IJ SOLN
30.0000 mL | INTRAMUSCULAR | Status: DC | PRN
  Administered 2017-10-21: 30 mL via SUBCUTANEOUS
  Filled 2017-10-21: qty 30

## 2017-10-21 MED ORDER — PENICILLIN G POTASSIUM 5000000 UNITS IJ SOLR
5.0000 10*6.[IU] | Freq: Once | INTRAVENOUS | Status: AC
  Administered 2017-10-21: 5 10*6.[IU] via INTRAVENOUS
  Filled 2017-10-21: qty 5

## 2017-10-21 MED ORDER — ACETAMINOPHEN 325 MG PO TABS: 650.0000 mg | ORAL_TABLET | ORAL | Status: DC | PRN

## 2017-10-21 MED ORDER — FAMOTIDINE 20 MG PO TABS
10.0000 mg | ORAL_TABLET | Freq: Two times a day (BID) | ORAL | Status: DC
  Filled 2017-10-21: qty 1

## 2017-10-21 MED ORDER — FENTANYL CITRATE (PF) 100 MCG/2ML IJ SOLN: 100.0000 ug | Freq: Once | INTRAMUSCULAR | Status: DC

## 2017-10-21 MED ORDER — FENTANYL CITRATE (PF) 100 MCG/2ML IJ SOLN
100.0000 ug | INTRAMUSCULAR | Status: DC | PRN
  Administered 2017-10-21 (×2): 100 ug via INTRAVENOUS
  Filled 2017-10-21 (×2): qty 2

## 2017-10-21 NOTE — Anesthesia Preprocedure Evaluation (Deleted)
Anesthesia Evaluation  Patient identified by MRN, date of birth, ID band Patient awake    Reviewed: Allergy & Precautions, NPO status , Patient's Chart, lab work & pertinent test results  Airway Mallampati: II  TM Distance: >3 FB Neck ROM: Full    Dental no notable dental hx.    Pulmonary neg pulmonary ROS,    Pulmonary exam normal breath sounds clear to auscultation       Cardiovascular negative cardio ROS Normal cardiovascular exam Rhythm:Regular Rate:Normal     Neuro/Psych negative neurological ROS  negative psych ROS   GI/Hepatic negative GI ROS, Neg liver ROS,   Endo/Other  negative endocrine ROS  Renal/GU negative Renal ROS     Musculoskeletal negative musculoskeletal ROS (+)   Abdominal   Peds  Hematology negative hematology ROS (+)   Anesthesia Other Findings   Reproductive/Obstetrics (+) Pregnancy                             Anesthesia Physical Anesthesia Plan  ASA: II  Anesthesia Plan: Epidural   Post-op Pain Management:    Induction:   PONV Risk Score and Plan:   Airway Management Planned:   Additional Equipment:   Intra-op Plan:   Post-operative Plan:   Informed Consent: I have reviewed the patients History and Physical, chart, labs and discussed the procedure including the risks, benefits and alternatives for the proposed anesthesia with the patient or authorized representative who has indicated his/her understanding and acceptance.       Plan Discussed with:   Anesthesia Plan Comments:         Anesthesia Quick Evaluation  

## 2017-10-21 NOTE — Progress Notes (Signed)
Margaret Spence is a 32 y.o. G3P1011 at 529w4d  admitted for active labor  Subjective:  No complaints at this time. Has had IV pain medication reports that pain is more manageable now.  Objective: BP 115/79   Pulse (!) 102   Temp 98.3 F (36.8 C) (Oral)   Resp 20   Ht 5\' 5"  (1.651 m)   Wt 183 lb (83 kg)   LMP 12/26/2016   SpO2 99%   BMI 30.45 kg/m  No intake/output data recorded. No intake/output data recorded.  FHT:  FHR: 130 bpm, variability: moderate,  accelerations:  Present,  decelerations:  Absent UC:   regular, every 2-3 minutes SVE:   Dilation: 4 Effacement (%): 100 Station: -2 Exam by:: Dr. Nira Retortegele  Labs: Lab Results  Component Value Date   WBC 9.5 10/21/2017   HGB 11.5 (L) 10/21/2017   HCT 34.5 (L) 10/21/2017   MCV 92.0 10/21/2017   PLT 294 10/21/2017    Assessment / Plan: Spontaneous labor, progressing normally  Labor: Progressing normally Preeclampsia:  NA Fetal Wellbeing:  Category I Pain Control:  IV pain meds I/D:  n/a Anticipated MOD:  NSVD  Thressa ShellerHeather Hogan 10/21/2017, 8:58 PM

## 2017-10-21 NOTE — MAU Note (Signed)
Pt reports  having  been contracting since last night at 2300. States the contractions are irregular between 2-7 min apart and strong. Denies any vag bleeding or leaking. Says the baby is moving.

## 2017-10-21 NOTE — H&P (Signed)
OBSTETRIC ADMISSION HISTORY AND PHYSICAL  Margaret Spence is a 32 y.o. female G65P1011 with IUP at 55w4dby LMP presenting for contractions, now Q2-781m, onset 2300 1/7. Her contractions are very uncomfortable. She reports +FMs, No LOF, no VB, no blurry vision, headaches or peripheral edema, and RUQ pain.  She plans on breastfeeding. She request depo for birth control. [facility]  Dating: By LMP --->  Estimated Date of Delivery: 10/17/17  Prenatal History/Complications: PNC at [facility] Complications: - Rubella nonimmune - THC use - RH negative - GBS positive  Past Medical History: Past Medical History:  Diagnosis Date  . Medical history non-contributory     Past Surgical History: Past Surgical History:  Procedure Laterality Date  . INDUCED ABORTION    . VAGINAL DELIVERY      Obstetrical History: OB History    Gravida Para Term Preterm AB Living   '3 1 1   1 1   ' SAB TAB Ectopic Multiple Live Births     1     1      Social History: Social History   Socioeconomic History  . Marital status: Single    Spouse name: None  . Number of children: None  . Years of education: None  . Highest education level: None  Social Needs  . Financial resource strain: None  . Food insecurity - worry: None  . Food insecurity - inability: None  . Transportation needs - medical: None  . Transportation needs - non-medical: None  Occupational History  . None  Tobacco Use  . Smoking status: Never Smoker  . Smokeless tobacco: Never Used  Substance and Sexual Activity  . Alcohol use: No    Frequency: Never    Comment: 1-2 X per week- not with preg  . Drug use: No    Comment: at least 1 X/day last used in first trimester  . Sexual activity: Yes    Birth control/protection: None  Other Topics Concern  . None  Social History Narrative  . None    Family History: Family History  Problem Relation Age of Onset  . Diabetes Mother   . Hypertension  Mother     Allergies: Allergies  Allergen Reactions  . Other     Tree nuts - rash  . Mushroom Extract Complex Rash    Medications Prior to Admission  Medication Sig Dispense Refill Last Dose  . Prenatal Vit-Fe Fumarate-FA (PRENATAL MULTIVITAMIN) TABS tablet Take 1 tablet by mouth daily at 12 noon.   10/20/2017 at Unknown time  . ranitidine (ZANTAC) 150 MG tablet Take 150 mg by mouth 2 (two) times daily.   10/21/2017 at Unknown time     Review of Systems   All systems reviewed and negative except as stated in HPI  Blood pressure 109/72, pulse (!) 107, temperature 97.9 F (36.6 C), temperature source Oral, resp. rate 16, height '5\' 5"'  (1.651 m), weight 83 kg (183 lb), last menstrual period 12/26/2016. General appearance: alert, cooperative, appears stated age and mild distress Lungs: clear to auscultation bilaterally Heart: regular rate and rhythm Abdomen: gravid, soft, non-tender; bowel sounds normal Extremities: Homans sign is negative, no sign of DVT, trace edema Presentation: cephalic Fetal monitoringBaseline: 135 bpm, Variability: Good {> 6 bpm), Accelerations: Reactive and Decelerations: Absent Uterine activityFrequency: Every 5-10 minutes Dilation: 4 Effacement (%): 100 Station: -2, -1 Exam by:: K. WEissRN  Prenatal labs: ABO, Rh: O/Negative/-- (05/12 0000) Antibody: Negative (05/12 0000) Rubella: Nonimmune (05/12 0000)  RPR: Nonreactive (05/12 0000)  HBsAg: Negative (05/12 0000)  HIV: Non-reactive (05/12 0000)  GBS: Positive (12/06 0000)  1 hr Glucola WNL Genetic screening  WNL Anatomy US normal  Prenatal Transfer Tool  Maternal Diabetes: No Genetic Screening: Normal Maternal Ultrasounds/Referrals: Normal Fetal Ultrasounds or other Referrals:  None Maternal Substance Abuse:  Yes:  Type: Marijuana (prior to pregnancy, negative UDS's at HD) Significant Maternal Medications:  None Significant Maternal Lab Results: Lab values include: Group B Strep positive  No  results found for this or any previous visit (from the past 24 hour(s)).  There are no active problems to display for this patient.   Assessment/Plan:  Margaret Spence is a 32 y.o. G3P1011 at 83w4dhere for normal labor.   #Labor: Admit to L&D for delivery. Expectant management.  #Pain: PRN meds available, currently not well controlled per patient.  #FWB: Cat 1 NST #ID:  GBS+, will ppx w/ PCN #MOF: breast #MOC: depo #Circ:  n/a  PLarwance Rote MD  10/21/2017, 4:42 PM  OB FELLOW HISTORY AND PHYSICAL ATTESTATION I have seen and examined this patient; I agree with above documentation in the resident's note.   SOL. GBS pos, start PCN. FHT Cat I Pain control: would like to try nitrous oxide; not planning on getting epidural Hx of THC use: get UDS Rubella nonimmune: MMR postpartum   JGailen Shelter MD OB Fellow 10/21/2017

## 2017-10-22 ENCOUNTER — Encounter (HOSPITAL_COMMUNITY): Payer: Self-pay

## 2017-10-22 LAB — RPR: RPR Ser Ql: NONREACTIVE

## 2017-10-22 MED ORDER — OXYCODONE-ACETAMINOPHEN 5-325 MG PO TABS
1.0000 | ORAL_TABLET | ORAL | Status: DC | PRN
  Administered 2017-10-22 – 2017-10-23 (×3): 1 via ORAL
  Filled 2017-10-22 (×4): qty 1

## 2017-10-22 MED ORDER — DIBUCAINE 1 % RE OINT: 1.0000 "application " | TOPICAL_OINTMENT | RECTAL | Status: DC | PRN

## 2017-10-22 MED ORDER — SENNOSIDES-DOCUSATE SODIUM 8.6-50 MG PO TABS
2.0000 | ORAL_TABLET | ORAL | Status: DC
  Administered 2017-10-23: 2 via ORAL
  Filled 2017-10-22: qty 2

## 2017-10-22 MED ORDER — MAGNESIUM HYDROXIDE 400 MG/5ML PO SUSP: 30.0000 mL | ORAL | Status: DC | PRN

## 2017-10-22 MED ORDER — SIMETHICONE 80 MG PO CHEW: 80.0000 mg | CHEWABLE_TABLET | ORAL | Status: DC | PRN

## 2017-10-22 MED ORDER — DIPHENHYDRAMINE HCL 25 MG PO CAPS: 25.0000 mg | ORAL_CAPSULE | Freq: Four times a day (QID) | ORAL | Status: DC | PRN

## 2017-10-22 MED ORDER — ONDANSETRON HCL 4 MG PO TABS: 4.0000 mg | ORAL_TABLET | ORAL | Status: DC | PRN

## 2017-10-22 MED ORDER — ACETAMINOPHEN 325 MG PO TABS: 650.0000 mg | ORAL_TABLET | ORAL | Status: DC | PRN

## 2017-10-22 MED ORDER — ZOLPIDEM TARTRATE 5 MG PO TABS: 5.0000 mg | ORAL_TABLET | Freq: Every evening | ORAL | Status: DC | PRN

## 2017-10-22 MED ORDER — MEASLES, MUMPS & RUBELLA VAC ~~LOC~~ INJ
0.5000 mL | INJECTION | Freq: Once | SUBCUTANEOUS | Status: DC
  Filled 2017-10-22: qty 0.5

## 2017-10-22 MED ORDER — WITCH HAZEL-GLYCERIN EX PADS: 1.0000 "application " | MEDICATED_PAD | CUTANEOUS | Status: DC | PRN

## 2017-10-22 MED ORDER — COCONUT OIL OIL
1.0000 "application " | TOPICAL_OIL | Status: DC | PRN
  Administered 2017-10-22: 1 via TOPICAL
  Filled 2017-10-22: qty 120

## 2017-10-22 MED ORDER — TETANUS-DIPHTH-ACELL PERTUSSIS 5-2.5-18.5 LF-MCG/0.5 IM SUSP: 0.5000 mL | Freq: Once | INTRAMUSCULAR | Status: DC

## 2017-10-22 MED ORDER — ONDANSETRON HCL 4 MG/2ML IJ SOLN: 4.0000 mg | INTRAMUSCULAR | Status: DC | PRN

## 2017-10-22 MED ORDER — RHO D IMMUNE GLOBULIN 1500 UNIT/2ML IJ SOSY
300.0000 ug | PREFILLED_SYRINGE | Freq: Once | INTRAMUSCULAR | Status: AC
  Administered 2017-10-22: 300 ug via INTRAVENOUS
  Filled 2017-10-22: qty 2

## 2017-10-22 MED ORDER — MISOPROSTOL 200 MCG PO TABS
ORAL_TABLET | ORAL | Status: AC
  Filled 2017-10-22: qty 3

## 2017-10-22 MED ORDER — PRENATAL MULTIVITAMIN CH
1.0000 | ORAL_TABLET | Freq: Every day | ORAL | Status: DC
  Administered 2017-10-22: 1 via ORAL
  Filled 2017-10-22: qty 1

## 2017-10-22 MED ORDER — IBUPROFEN 600 MG PO TABS
600.0000 mg | ORAL_TABLET | Freq: Four times a day (QID) | ORAL | Status: DC
  Administered 2017-10-22 – 2017-10-23 (×5): 600 mg via ORAL
  Filled 2017-10-22 (×6): qty 1

## 2017-10-22 MED ORDER — BENZOCAINE-MENTHOL 20-0.5 % EX AERO: 1.0000 "application " | INHALATION_SPRAY | CUTANEOUS | Status: DC | PRN

## 2017-10-22 MED ORDER — OXYCODONE-ACETAMINOPHEN 5-325 MG PO TABS: 2.0000 | ORAL_TABLET | ORAL | Status: DC | PRN

## 2017-10-22 MED ORDER — SODIUM CHLORIDE 0.9% FLUSH
3.0000 mL | INTRAVENOUS | Status: DC | PRN
Start: 2017-10-22 — End: 2017-10-23
  Administered 2017-10-22: 3 mL via INTRAVENOUS

## 2017-10-22 MED ORDER — MISOPROSTOL 200 MCG PO TABS
600.0000 ug | ORAL_TABLET | Freq: Once | ORAL | Status: AC
  Administered 2017-10-22: 600 ug via BUCCAL

## 2017-10-22 NOTE — Progress Notes (Signed)
Post Partum Day 1  Subjective: no complaints, up ad lib, voiding and tolerating PO  Objective: Vitals:   10/22/17 0200 10/22/17 0608  BP: 109/61 (!) 93/55  Pulse: 89 82  Resp: 18 18  Temp: 100 F (37.8 C) 98.8 F (37.1 C)  SpO2:      Breastfeeding: well  Physical Exam:  General: alert and cooperative Lochia: appropriate Uterine Fundus: firm Incision: n/a DVT Evaluation: No evidence of DVT seen on physical exam.   Assessment/Plan: Plan for discharge tomorrow, Breastfeeding and Contraception Depo   LOS: 2 day

## 2017-10-22 NOTE — Plan of Care (Signed)
Progressing appropriately. Went over admission information.

## 2017-10-23 DIAGNOSIS — Z283 Underimmunization status: Secondary | ICD-10-CM

## 2017-10-23 DIAGNOSIS — O26899 Other specified pregnancy related conditions, unspecified trimester: Secondary | ICD-10-CM

## 2017-10-23 DIAGNOSIS — O9989 Other specified diseases and conditions complicating pregnancy, childbirth and the puerperium: Secondary | ICD-10-CM

## 2017-10-23 DIAGNOSIS — Z6791 Unspecified blood type, Rh negative: Secondary | ICD-10-CM

## 2017-10-23 DIAGNOSIS — O99892 Other specified diseases and conditions complicating childbirth: Secondary | ICD-10-CM

## 2017-10-23 LAB — RH IG WORKUP (INCLUDES ABO/RH)
ABO/RH(D): O NEG
Fetal Screen: NEGATIVE
Gestational Age(Wks): 40.4
UNIT DIVISION: 0

## 2017-10-23 MED ORDER — IBUPROFEN 600 MG PO TABS: 600.0000 mg | ORAL_TABLET | Freq: Four times a day (QID) | ORAL | 0 refills | Status: DC | PRN

## 2017-10-23 MED ORDER — MEASLES, MUMPS & RUBELLA VAC ~~LOC~~ INJ
0.5000 mL | INJECTION | Freq: Once | SUBCUTANEOUS | Status: AC
  Administered 2017-10-23: 0.5 mL via SUBCUTANEOUS
  Filled 2017-10-23: qty 0.5

## 2017-10-23 NOTE — Progress Notes (Signed)
CSW received consult for hx of marijuana use.  Referral was screened out due to the following: °~MOB had no documented substance use after initial prenatal visit/+UPT. °~MOB had no positive drug screens after initial prenatal visit/+UPT. °~Baby's UDS is negative. ° °Please consult CSW if current concerns arise or by MOB's request. ° °CSW will monitor CDS results and make report to Child Protective Services if warranted. ° °Nainoa Woldt Boyd-Gilyard, MSW, LCSW °Clinical Social Work °[phone] ° °

## 2017-10-23 NOTE — Discharge Summary (Signed)
OB Discharge Summary     Patient Name: Margaret Spence DOB: Mar 21, 1986 MRN: [id number] Date of admission: 10/21/2017  Delivering MD: Marcille Buffy D )  Date of discharge: 10/23/2017   Admitting diagnosis: Spontaneous onset of labor Intrauterine pregnancy: [redacted]w[redacted]d   Secondary diagnosis:     Patient Active Problem List   Diagnosis Date Noted  . Rh negative status during pregnancy 10/23/2017  . Rubella nonimmune status, delivered, current hospitalization 10/23/2017  . Normal labor 10/21/2017    Additional problems: none     Discharge diagnosis: Term Pregnancy Delivered                                                                                                Post partum procedures: rhogam, MMR vaccine administered  Augmentation: AROM  Complications: None  Hospital course:  Onset of Labor With Vaginal Delivery     32y.o. yo [id number] 48w4dasas admitted in Active Labor on 10/21/2017. Patient had an uncomplicated labor course as follows:  Membrane Rupture Time/Date: 7:43 PM ,10/21/2017   Intrapartum Procedures: Episiotomy: None [1]                                         Lacerations:  2nd degree [3];Periurethral [8]  Patient had a delivery of a Viable infant. 10/21/2017  Information for the patient's newborn:  MiRashi, Granier0[[id number]]Delivery Method: Vag-Spont    Pateint had an uncomplicated postpartum course.  Rubella nonimmune, received MMR vaccine prior to discharge. Rh negative, received Rhogam within 72 hours. She is ambulating, tolerating a regular diet, passing flatus, and urinating well. Patient is discharged home in stable condition on 10/23/17.   Physical exam  Vitals:   10/22/17 2212 10/23/17 0526  BP: (!) 94/53 (!) 91/48  Pulse: 80 66  Resp: 20 16  Temp: 98.5 F (36.9 C) 97.6 F (36.4 C)  SpO2:      General: alert, cooperative and no distress Lochia: appropriate Uterine Fundus: firm Incision: N/A DVT Evaluation: No evidence of DVT  seen on physical exam.  Labs:  CBC    Component Value Date/Time   WBC 9.5 10/21/2017 1640   RBC 3.75 (L) 10/21/2017 1640   HGB 11.5 (L) 10/21/2017 1640   HCT 34.5 (L) 10/21/2017 1640   PLT 294 10/21/2017 1640   MCV 92.0 10/21/2017 1640   MCH 30.7 10/21/2017 1640   MCHC 33.3 10/21/2017 1640   RDW 13.5 10/21/2017 1640    Discharge instruction: per After Visit Summary and "Baby and Me Booklet".  After visit meds:  Allergies  Allergen Reactions  . Other     Tree nuts - rash  . Mushroom Extract Complex Rash    Allergies as of 10/23/2017      Reactions   Other    Tree nuts - rash   Mushroom Extract Complex Rash      Medication List    STOP taking these medications   ranitidine 150 MG tablet Commonly known as:  ZANTAC  TAKE these medications   ibuprofen 600 MG tablet Commonly known as:  ADVIL,MOTRIN Take 1 tablet (600 mg total) by mouth every 6 (six) hours as needed.   prenatal multivitamin Tabs tablet Take 1 tablet by mouth daily at 12 noon.       Diet: routine diet  Activity: Advance as tolerated. Pelvic rest for 6 weeks.   Outpatient follow up:4 weeks Future Appointments: No future appointments.  Follow up Appt: No Follow-up on file.  Postpartum contraception: Depo Provera  Newborn Data: APGAR (1 MIN): 8   APGAR (5 MINS): 9   Weight 8 lb 4.1 oz (3745 g)   Baby Feeding: Breast Disposition:home with mother  Willa Frater, Medical Student  10/23/2017   CNM attestation I have seen and examined this patient and agree with above documentation in the med student's note.   IVETT LUEBBE is a 32 y.o. [id number] s/p SVD.   Pain is well controlled.  Plan for birth control is Depo-Provera.  Method of Feeding: both  PE:  BP (!) 91/48 (BP Location: Right Arm)   Pulse 66   Temp 97.6 F (36.4 C) (Oral)   Resp 16   Ht '5\' 5"'  (1.651 m)   Wt 83 kg (183 lb)   LMP 12/26/2016   SpO2 99%   Breastfeeding? Unknown   BMI 30.45 kg/m   Fundus firm  Recent Labs    10/21/17 1640  HGB 11.5*  HCT 34.5*     Plan: discharge today - postpartum care discussed - f/u clinic in 4 weeks for postpartum visit   Serita Grammes, CNM 9:06 AM 10/23/2017

## 2017-10-23 NOTE — Discharge Instructions (Signed)
Vaginal Delivery, Care After °Refer to this sheet in the next few weeks. These instructions provide you with information about caring for yourself after vaginal delivery. Your health care provider may also give you more specific instructions. Your treatment has been planned according to current medical practices, but problems sometimes occur. Call your health care provider if you have any problems or questions. °What can I expect after the procedure? °After vaginal delivery, it is common to have: °· Some bleeding from your vagina. °· Soreness in your abdomen, your vagina, and the area of skin between your vaginal opening and your anus (perineum). °· Pelvic cramps. °· Fatigue. ° °Follow these instructions at home: °Medicines °· Take over-the-counter and prescription medicines only as told by your health care provider. °· If you were prescribed an antibiotic medicine, take it as told by your health care provider. Do not stop taking the antibiotic until it is finished. °Driving ° °· Do not drive or operate heavy machinery while taking prescription pain medicine. °· Do not drive for 24 hours if you received a sedative. °Lifestyle °· Do not drink alcohol. This is especially important if you are breastfeeding or taking medicine to relieve pain. °· Do not use tobacco products, including cigarettes, chewing tobacco, or e-cigarettes. If you need help quitting, ask your health care provider. °Eating and drinking °· Drink at least 8 eight-ounce glasses of water every day unless you are told not to by your health care provider. If you choose to breastfeed your baby, you may need to drink more water than this. °· Eat high-fiber foods every day. These foods may help prevent or relieve constipation. High-fiber foods include: °? Whole grain cereals and breads. °? Brown rice. °? Beans. °? Fresh fruits and vegetables. °Activity °· Return to your normal activities as told by your health care provider. Ask your health care provider  what activities are safe for you. °· Rest as much as possible. Try to rest or take a nap when your baby is sleeping. °· Do not lift anything that is heavier than your baby or 10 lb (4.5 kg) until your health care provider says that it is safe. °· Talk with your health care provider about when you can engage in sexual activity. This may depend on your: °? Risk of infection. °? Rate of healing. °? Comfort and desire to engage in sexual activity. °Vaginal Care °· If you have an episiotomy or a vaginal tear, check the area every day for signs of infection. Check for: °? More redness, swelling, or pain. °? More fluid or blood. °? Warmth. °? Pus or a bad smell. °· Do not use tampons or douches until your health care provider says this is safe. °· Watch for any blood clots that may pass from your vagina. These may look like clumps of dark red, brown, or black discharge. °General instructions °· Keep your perineum clean and dry as told by your health care provider. °· Wear loose, comfortable clothing. °· Wipe from front to back when you use the toilet. °· Ask your health care provider if you can shower or take a bath. If you had an episiotomy or a perineal tear during labor and delivery, your health care provider may tell you not to take baths for a certain length of time. °· Wear a bra that supports your breasts and fits you well. °· If possible, have someone help you with household activities and help care for your baby for at least a few days after   you leave the hospital. °· Keep all follow-up visits for you and your baby as told by your health care provider. This is important. °Contact a health care provider if: °· You have: °? Vaginal discharge that has a bad smell. °? Difficulty urinating. °? Pain when urinating. °? A sudden increase or decrease in the frequency of your bowel movements. °? More redness, swelling, or pain around your episiotomy or vaginal tear. °? More fluid or blood coming from your episiotomy or  vaginal tear. °? Pus or a bad smell coming from your episiotomy or vaginal tear. °? A fever. °? A rash. °? Little or no interest in activities you used to enjoy. °? Questions about caring for yourself or your baby. °· Your episiotomy or vaginal tear feels warm to the touch. °· Your episiotomy or vaginal tear is separating or does not appear to be healing. °· Your breasts are painful, hard, or turn red. °· You feel unusually sad or worried. °· You feel nauseous or you vomit. °· You pass large blood clots from your vagina. If you pass a blood clot from your vagina, save it to show to your health care provider. Do not flush blood clots down the toilet without having your health care provider look at them. °· You urinate more than usual. °· You are dizzy or light-headed. °· You have not breastfed at all and you have not had a menstrual period for 12 weeks after delivery. °· You have stopped breastfeeding and you have not had a menstrual period for 12 weeks after you stopped breastfeeding. °Get help right away if: °· You have: °? Pain that does not go away or does not get better with medicine. °? Chest pain. °? Difficulty breathing. °? Blurred vision or spots in your vision. °? Thoughts about hurting yourself or your baby. °· You develop pain in your abdomen or in one of your legs. °· You develop a severe headache. °· You faint. °· You bleed from your vagina so much that you fill two sanitary pads in one hour. °This information is not intended to replace advice given to you by your health care provider. Make sure you discuss any questions you have with your health care provider. °Document Released: 09/27/2000 Document Revised: 03/13/2016 Document Reviewed: 10/15/2015 °Elsevier Interactive Patient Education © 2018 [employer] ° °

## 2017-10-23 NOTE — Lactation Note (Signed)
This note was copied from a baby's chart. Lactation Consultation Note Mom BF her 1st child for 3 months. Mom had baby latched on the breast in cradle position. Encouraged for sore nipples football. Patient Name: Girl Marcha DuttonChristina Loken ZOXWR'UToday's Date: 10/23/2017 Reason for consult: Follow-up assessment   Maternal Data Has patient been taught Hand Expression?: Yes Does the patient have breastfeeding experience prior to this delivery?: No  Feeding Feeding Type: Breast Fed Length of feed: (still BF)  LATCH Score Latch: Grasps breast easily, tongue down, lips flanged, rhythmical sucking.  Audible Swallowing: A few with stimulation  Type of Nipple: Everted at rest and after stimulation  Comfort (Breast/Nipple): Filling, red/small blisters or bruises, mild/mod discomfort  Hold (Positioning): No assistance needed to correctly position infant at breast.  LATCH Score: 8  Interventions Interventions: Breast feeding basics reviewed;Breast compression  Lactation Tools Discussed/Used Tools: Shells;Pump;Coconut oil Shell Type: Inverted Breast pump type: Manual WIC Program: Yes Pump Review: Setup, frequency, and cleaning;Milk Storage Initiated by:: RN Date initiated:: 10/22/16   Consult Status Consult Status: Follow-up Date: 10/23/17 Follow-up type: In-patient    Charyl DancerCARVER, Demian Maisel G 10/23/2017, 2:48 AM

## 2017-10-23 NOTE — Lactation Note (Signed)
This note was copied from a baby's chart. Lactation Consultation Note Baby 9627 hrs old. Mom c/o sore nipples. Mom using coconut oil. Mom has semi flat nipples, very short shaft after stimulation. Has bruising, mom stated they had blisters on them. Mom had nipples pierced in the past. Moms breast tender  W/hand expression. Few drops noted after about a minute of compression.  Mom stated baby had been wanting to BF constantly or just hold the nipple in her mouth and not suck. Educated on newborn behavior, STS, I&O, cluster feeding, supply and demand. Baby sucking on pacifier, discouraged for 2 weeks, mom stated she can't handle her sucking on her constantly.  Mom asked FOB to hold the baby and mumbled "no". Mom is tired. Encouraged pumping or hand expression. Mom stated she nipples hurt right now and needs a break.   Mom encouraged to feed baby 8-12 times/24 hours and with feeding cues. encouraged to call for assistance or questions.  [facility] brochure given w/resources, support groups and LC services.  Patient Name: Margaret Marcha DuttonChristina Spence WUJWJ'XToday's Date: 10/23/2017 Reason for consult: Initial assessment   Maternal Data Has patient been taught Hand Expression?: Yes Does the patient have breastfeeding experience prior to this delivery?: No  Feeding    LATCH Score       Type of Nipple: Everted at rest and after stimulation(semi flat)  Comfort (Breast/Nipple): Filling, red/small blisters or bruises, mild/mod discomfort        Interventions Interventions: Breast feeding basics reviewed;Breast compression;Assisted with latch;Adjust position;Hand pump;Support pillows;Breast massage;Position options;Coconut oil;Shells  Lactation Tools Discussed/Used Tools: Shells;Pump;Coconut oil Shell Type: Inverted Breast pump type: Manual WIC Program: Yes Pump Review: Setup, frequency, and cleaning;Milk Storage Initiated by:: RN Date initiated:: 10/22/16   Consult Status Consult Status:  Follow-up Date: 10/23/17 Follow-up type: In-patient    Gurtha Picker, Diamond NickelLAURA G 10/23/2017, 2:32 AM

## 2017-10-23 NOTE — Lactation Note (Signed)
This note was copied from a baby's chart. Lactation Consultation Note; Rn talked to mom about trying NS and mom agreeable to try it. Used #20 NS. Mom reports it feels some better. Baby nursed for 15 min on right breast. Mom correctly applied NS to left breast and baby nursed for about 10 min then going off to sleep. A drop of Colostrum noted in NS when baby came off the breast. Mom states she still plans to bottle feed formula at some feedings until her milk comes in. Reviewed engorgement prevention and treatment. Has manual pump for homer\. Encouraged to pump every time she feeds formula to promote a good milk supply and prevent engorgement. No questions at present. To call prn  Patient Name: Margaret Spence WUJWJ'XToday's Date: 10/23/2017 Reason for consult: Follow-up assessment   Maternal Data Formula Feeding for Exclusion: Yes Reason for exclusion: Mother's choice to formula and breast feed on admission Has patient been taught Hand Expression?: Yes Does the patient have breastfeeding experience prior to this delivery?: Yes  Feeding Feeding Type: Breast Fed  LATCH Score Latch: Grasps breast easily, tongue down, lips flanged, rhythmical sucking.  Audible Swallowing: A few with stimulation  Type of Nipple: Everted at rest and after stimulation  Comfort (Breast/Nipple): Filling, red/small blisters or bruises, mild/mod discomfort  Hold (Positioning): Assistance needed to correctly position infant at breast and maintain latch.  LATCH Score: 7  Interventions Interventions: Breast feeding basics reviewed;Assisted with latch  Lactation Tools Discussed/Used Tools: Nipple Shields Nipple shield size: 20 Breast pump type: Manual WIC Program: Yes   Consult Status Consult Status: Complete    Margaret HoitWeeks, Chauncey Sciulli D 10/23/2017, 10:50 AM

## 2017-10-23 NOTE — Lactation Note (Signed)
This note was copied from a baby's chart. Lactation ConsultatioNote  Asked by RN to assist with feeding. Mom has baby skin to skin when I went into room and baby is asleep on mom's chest. Mom states baby is not showing feeding cues so she wants to wait to feed. Last fed formula about 3 hours ago. Suggested waking baby in 15-30 min to feed. When asked if she wants to breast feed at this feeding she said no, her nipples are too sore. Encouraged mom to call if she decides to put baby to breast.   Patient Name: Margaret Marcha DuttonChristina Brazier JYNWG'NToday's Date: 10/23/2017 Reason for consult: Follow-up assessment   Maternal Data Formula Feeding for Exclusion: Yes Reason for exclusion: Mother's choice to formula and breast feed on admission Has patient been taught Hand Expression?: Yes Does the patient have breastfeeding experience prior to this delivery?: Yes  Feeding Feeding Type: Formula Nipple Type: Slow - flow  LATCH Score       Type of Nipple: Everted at rest and after stimulation  Comfort (Breast/Nipple): Filling, red/small blisters or bruises, mild/mod discomfort(positional stripes noted on both nipples)        Interventions    Lactation Tools Discussed/Used Tools: Coconut oil;Comfort gels WIC Program: Yes   Consult Status Consult Status: Complete    [name], Jinnifer Montejano D 10/23/2017, 9:47 AM

## 2017-10-23 NOTE — Lactation Note (Signed)
This note was copied from a baby's chart. Lactation Consultation Note; Mom reports nipples are very sore and she has given bottles of formula the last 2 feedings. Baby asleep in bassinet at this time. Mom with positional stripes noted on both nipples. Encouraged changing positions to help nipples heal. Given comfort gels to try - reviewed use and cleaning with mom. Has coconut oil. Also encouraged EBM to nipples after nursing. Offered assist with latch before DC if mom desires- encouraged to page for assist. Encouraged mom to pump to promote a good milk supply. Has manual pump for home.Reviewed our phone number, OP appointments and [facility] as resources for support after DC. No questions at present.   Patient Name: Margaret Spence'JToday's Date: 10/23/2017 Reason for consult: Follow-up assessment   Maternal Data Formula Feeding for Exclusion: Yes Reason for exclusion: Mother's choice to formula and breast feed on admission Has patient been taught Hand Expression?: Yes Does the patient have breastfeeding experience prior to this delivery?: Yes  Feeding Feeding Type: Formula Nipple Type: Slow - flow  LATCH Score       Type of Nipple: Everted at rest and after stimulation  Comfort (Breast/Nipple): Filling, red/small blisters or bruises, mild/mod discomfort(positional stripes noted on both nipples)        Interventions    Lactation Tools Discussed/Used Tools: Coconut oil;Comfort gels WIC Program: Yes   Consult Status Consult Status: Complete    [name], [name] 10/23/2017, 8:40 AM

## 2017-10-23 NOTE — Lactation Note (Signed)
This note was copied from a baby's chart. Patient called RN to room, states "my nipples are really sore.  I want to skip this feeding and give formula."  Encouraged Mom to try and express colostrum to supplement baby.  Mom agreeable.  Nipples very tender, unable to hand express any colostrum from either breast.  Asked Mom how she would like to feed the baby formula, encouraged finger/syringe feeding.  Mom states she would like to give the baby formula with a bottle.

## 2017-10-24 ENCOUNTER — Inpatient Hospital Stay (HOSPITAL_COMMUNITY): Payer: Medicaid Other

## 2017-10-25 LAB — BPAM RBC
BLOOD PRODUCT EXPIRATION DATE: 201901182359
Blood Product Expiration Date: 201901152359
ISSUE DATE / TIME: 201901101657
UNIT TYPE AND RH: 9500
Unit Type and Rh: 9500

## 2017-10-25 LAB — TYPE AND SCREEN
ABO/RH(D): O NEG
Antibody Screen: POSITIVE
UNIT DIVISION: 0
Unit division: 0

## 2018-02-07 ENCOUNTER — Ambulatory Visit (HOSPITAL_COMMUNITY)
Admission: EM | Admit: 2018-02-07 | Discharge: 2018-02-07 | Disposition: A | Discharge status: Home / Self Care | Payer: Medicaid Other | Attending: Family Medicine | Admitting: Family Medicine

## 2018-02-07 ENCOUNTER — Encounter (HOSPITAL_COMMUNITY): Payer: Self-pay | Admitting: Emergency Medicine

## 2018-02-07 DIAGNOSIS — M67432 Ganglion, left wrist: Secondary | ICD-10-CM

## 2018-02-07 MED ORDER — IBUPROFEN 600 MG PO TABS: 600.0000 mg | ORAL_TABLET | Freq: Four times a day (QID) | ORAL | 0 refills | Status: DC | PRN

## 2018-02-07 NOTE — ED Provider Notes (Signed)
[facility]   [id number] 02/07/18 Arrival Time: 1425  ASSESSMENT & PLAN:  1. Ganglion of left wrist    Given wrist splint to ease discomfort caused by ganglion cyst.  Meds ordered this encounter  Medications  . ibuprofen (ADVIL,MOTRIN) 600 MG tablet    Sig: Take 1 tablet (600 mg total) by mouth every 6 (six) hours as needed.    Dispense:  30 tablet    Refill:  0   To see hand surgeon if getting worse.  Reviewed expectations re: course of current medical issues. Questions answered. Outlined signs and symptoms indicating need for more acute intervention. Patient verbalized understanding. After Visit Summary given.  SUBJECTIVE: History from: patient. Margaret Spence is a 32 y.o. female who reports intermittent mild pain of her right wirst along with "a lump" that is stable; described as soreness without radiation. Onset: gradual, a week ago. Injury/trama: no. Relieved by: not moving wrist. Worsened by: certain movements. Associated symptoms: none reported. Extremity sensation changes or weakness: none. Self treatment: has not tried OTCs for relief of pain. History of similar: yes, "the lump comes and goes"  ROS: As per HPI.   OBJECTIVE:  Vitals:   02/07/18 1444  BP: 111/67  Pulse: 66  Resp: 18  Temp: 97.7 F (36.5 C)  SpO2: 100%    General appearance: alert; no distress Extremities: no cyanosis or edema; symmetrical with no gross deformities; ganglion cyst of dorsal wrist on the R; no erythema or skin changes; R wrist with FROM CV: normal extremity capillary refill Skin: warm and dry Neurologic: normal sensation in all extremities Psychological: alert and cooperative; normal mood and affect  Allergies  Allergen Reactions  . Other     Tree nuts - rash  . Mushroom Extract Complex Rash    Past Medical History:  Diagnosis Date  . Medical history non-contributory    Social History   Socioeconomic History  . Marital status: Single   Spouse name: Not on file  . Number of children: Not on file  . Years of education: Not on file  . Highest education level: Not on file  Occupational History  . Not on file  Social Needs  . Financial resource strain: Not on file  . Food insecurity:    Worry: Not on file    Inability: Not on file  . Transportation needs:    Medical: Not on file    Non-medical: Not on file  Tobacco Use  . Smoking status: Never Smoker  . Smokeless tobacco: Never Used  Substance and Sexual Activity  . Alcohol use: No    Frequency: Never    Comment: 1-2 X per week- not with preg  . Drug use: No    Types: Marijuana    Comment: at least 1 X/day last used in first trimester  . Sexual activity: Yes    Birth control/protection: None  Lifestyle  . Physical activity:    Days per week: Not on file    Minutes per session: Not on file  . Stress: Not on file  Relationships  . Social connections:    Talks on phone: Not on file    Gets together: Not on file    Attends religious service: Not on file    Active member of club or organization: Not on file    Attends meetings of clubs or organizations: Not on file    Relationship status: Not on file  . Intimate partner violence:    Fear of current  or ex partner: Not on file    Emotionally abused: Not on file    Physically abused: Not on file    Forced sexual activity: Not on file  Other Topics Concern  . Not on file  Social History Narrative  . Not on file   Family History  Problem Relation Age of Onset  . Diabetes Mother   . Hypertension Mother    Past Surgical History:  Procedure Laterality Date  . INDUCED ABORTION    . VAGINAL DELIVERY        Mardella Layman, MD 02/07/18 1539

## 2018-02-07 NOTE — Discharge Instructions (Addendum)
You may use over the counter ibuprofen or acetaminophen as needed.   If this persists or gets worse you may want to schedule an appointment with a hand surgeon.

## 2018-02-07 NOTE — ED Triage Notes (Signed)
Pt has bump on L wrist.  For a couple weeks. No injury

## 2018-04-26 ENCOUNTER — Ambulatory Visit (INDEPENDENT_AMBULATORY_CARE_PROVIDER_SITE_OTHER): Payer: Self-pay

## 2018-04-26 ENCOUNTER — Encounter (HOSPITAL_COMMUNITY): Payer: Self-pay | Admitting: *Deleted

## 2018-04-26 ENCOUNTER — Ambulatory Visit (HOSPITAL_COMMUNITY)
Admission: EM | Admit: 2018-04-26 | Discharge: 2018-04-26 | Disposition: A | Discharge status: Home / Self Care | Payer: Self-pay | Attending: Internal Medicine | Admitting: Internal Medicine

## 2018-04-26 DIAGNOSIS — S93402A Sprain of unspecified ligament of left ankle, initial encounter: Secondary | ICD-10-CM

## 2018-04-26 MED ORDER — IBUPROFEN 800 MG PO TABS: 800.0000 mg | ORAL_TABLET | Freq: Three times a day (TID) | ORAL | 0 refills | Status: AC

## 2018-04-26 NOTE — ED Triage Notes (Signed)
)  Pt reports stumbling on a stair at restaurant yesterday, injuring left ankle.  CMS intact.

## 2018-04-26 NOTE — Discharge Instructions (Signed)
To help your symptoms get better, you can:  Rest your muscle and avoid movements or activities that cause pain  Ice the area - You can put a cold gel pack, bag of ice, or bag of frozen vegetables on the painful muscle every 1 to 2 hours, for 15 minutes each time. Put a thin towel between the ice (or other cold object) and your skin. Use the ice (or other cold object) for at least 6 hours after the injury. Some people find it helpful to ice up to 2 days after an injury.  Wrap your muscle with an elastic bandage, other type of wrap, or fabric "sleeve" (picture 1) - This helps support your muscle.  Raise the muscle above the level of your heart (if possible) - For example, you can prop your leg up on pillows. This is helpful only for the first few days after an injury.  Take medicine to reduce the pain and swelling - If you have a lot of pain or a severe muscle strain, your doctor will prescribe a strong pain medicine. If your strain is not severe, you can take an over-the-counter medicine such as acetaminophen (sample brand name: Tylenol), ibuprofen (sample brand names: Advil, Motrin), or naproxen (sample brand name: Aleve).

## 2018-04-26 NOTE — ED Provider Notes (Signed)
[facility]    CSN: [id number] Arrival date & time: 04/26/18  1254     History   Chief Complaint Chief Complaint  Patient presents with  . Ankle Injury    HPI Margaret Spence is a 32 y.o. female.   Margaret Spence presents with complaints of left ankle pain after she slipped and twisted it last night at [employer]. Did not fall. Has had pain and mild swelling since. Tylenol has not helped. Has applied ice. Pain 9/10. Worse with weight bearing. No numbness or tingling. States she fractured her ankle as a child when she lived in [place]. Without contributing medical history.     ROS per HPI.      History reviewed. No pertinent past medical history.  Patient Active Problem List   Diagnosis Date Noted  . Rh negative status during pregnancy 10/23/2017  . Rubella nonimmune status, delivered, current hospitalization 10/23/2017  . Normal labor 10/21/2017    Past Surgical History:  Procedure Laterality Date  . INDUCED ABORTION    . VAGINAL DELIVERY      OB History    Gravida  3   Para  2   Term  2   Preterm      AB  1   Living  2     SAB      TAB  1   Ectopic      Multiple  0   Live Births  2            Home Medications    Prior to Admission medications   Medication Sig Start Date End Date Taking? Authorizing Provider  ibuprofen (ADVIL,MOTRIN) 800 MG tablet Take 1 tablet (800 mg total) by mouth 3 (three) times daily. 04/26/18   Georgetta HaberBurky, Natalie B, NP    Family History Family History  Problem Relation Age of Onset  . Diabetes Mother   . Hypertension Mother     Social History Social History   Tobacco Use  . Smoking status: Never Smoker  . Smokeless tobacco: Never Used  Substance Use Topics  . Alcohol use: No    Frequency: Never  . Drug use: No    Types: Marijuana    Comment: at least 1 X/day last used in first trimester     Allergies   Other and Mushroom extract complex   Review of Systems Review of  Systems   Physical Exam Triage Vital Signs ED Triage Vitals  Enc Vitals Group     BP --      Pulse Rate 04/26/18 1345 86     Resp 04/26/18 1345 16     Temp 04/26/18 1345 (!) 97.5 F (36.4 C)     Temp Source 04/26/18 1345 Oral     SpO2 04/26/18 1345 98 %     Weight --      Height --      Head Circumference --      Peak Flow --      Pain Score 04/26/18 1346 9     Pain Loc --      Pain Edu? --      Excl. in GC? --    No data found.  Updated Vital Signs Pulse 86   Temp (!) 97.5 F (36.4 C) (Oral)   Resp 16   LMP 04/01/2018 (Approximate)   SpO2 98%   Breastfeeding? No    Physical Exam  Constitutional: She is oriented to person, place, and time. She appears well-developed and well-nourished. No  distress.  Cardiovascular: Normal rate, regular rhythm and normal heart sounds.  Pulmonary/Chest: Effort normal and breath sounds normal.  Musculoskeletal:       Left ankle: She exhibits swelling. She exhibits normal range of motion, no ecchymosis, no deformity, no laceration and normal pulse. Achilles tendon normal.       Left foot: Normal.       Feet:  Soft tissue to both medial and lateral melleolus with tenderness; mild pain with ankle flexion; no pain with extension; moving toes; sensation intact; no foot tenderness; strong pedal pulse; cap refill < 2 seconds   Neurological: She is alert and oriented to person, place, and time.  Skin: Skin is warm and dry.     UC Treatments / Results  Labs (all labs ordered are listed, but only abnormal results are displayed) Labs Reviewed - No data to display  EKG None  Radiology Dg Ankle Complete Left  Result Date: 04/26/2018 CLINICAL DATA:  Patient states that she slipped and twisted her left ankle yesterday, continued pain with some lateral soft tissue swelling. Hx of ankle fracture as a child. EXAM: LEFT ANKLE COMPLETE - 3+ VIEW COMPARISON:  None. FINDINGS: No fracture.  No bone lesion. Ankle mortise is normally spaced and  aligned. Soft tissues are unremarkable. IMPRESSION: No fracture or dislocation. Electronically Signed   By: Amie Portland M.D.   On: 04/26/2018 14:39    Procedures Procedures (including critical care time)  Medications Ordered in UC Medications - No data to display  Initial Impression / Assessment and Plan / UC Course  I have reviewed the triage vital signs and the nursing notes.  Pertinent labs & imaging results that were available during my care of the patient were reviewed by me and considered in my medical decision making (see chart for details).     History, exam, and xray consistent with sprain. Exercises provide. ACE wrap applied. Ibuprofen PRN. Return precautions provided. Patient verbalized understanding and agreeable to plan.  Ambulatory out of clinic without difficulty.    Final Clinical Impressions(s) / UC Diagnoses   Final diagnoses:  Sprain of left ankle, unspecified ligament, initial encounter     Discharge Instructions     To help your symptoms get better, you can:  Rest your muscle and avoid movements or activities that cause pain  Ice the area - You can put a cold gel pack, bag of ice, or bag of frozen vegetables on the painful muscle every 1 to 2 hours, for 15 minutes each time. Put a thin towel between the ice (or other cold object) and your skin. Use the ice (or other cold object) for at least 6 hours after the injury. Some people find it helpful to ice up to 2 days after an injury.  Wrap your muscle with an elastic bandage, other type of wrap, or fabric "sleeve" (picture 1) - This helps support your muscle.  Raise the muscle above the level of your heart (if possible) - For example, you can prop your leg up on pillows. This is helpful only for the first few days after an injury.  Take medicine to reduce the pain and swelling - If you have a lot of pain or a severe muscle strain, your doctor will prescribe a strong pain medicine. If your strain is not severe,  you can take an over-the-counter medicine such as acetaminophen (sample brand name: Tylenol), ibuprofen (sample brand names: Advil, Motrin), or naproxen (sample brand name: Aleve).     ED  Prescriptions    Medication Sig Dispense Auth. Provider   ibuprofen (ADVIL,MOTRIN) 800 MG tablet Take 1 tablet (800 mg total) by mouth 3 (three) times daily. 30 tablet Georgetta Haber, NP     Controlled Substance Prescriptions [place] Controlled Substance Registry consulted? Not Applicable   Georgetta Haber, NP 04/26/18 1443

## 2019-03-04 IMAGING — US US MFM OB COMP +14 WKS
1 series · 14 of 28 positions shown · non-contrast
Comparison: none

[Series 1: us mfm ob comp +14 wks · 14 of 124 slices shown]
[im 5/124]
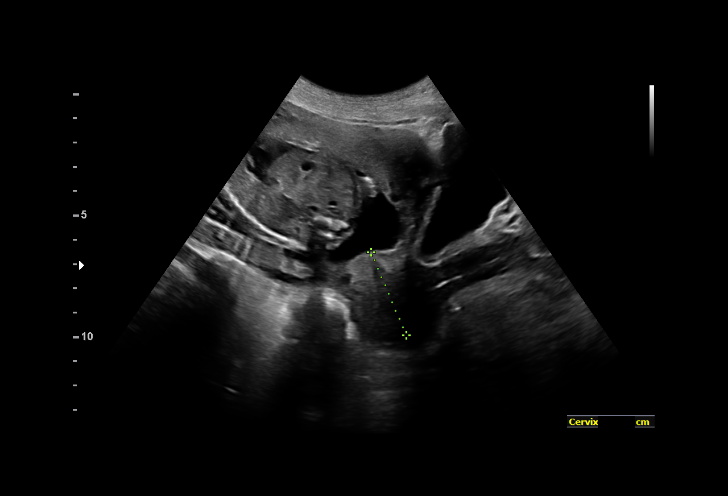
[im 14/124]
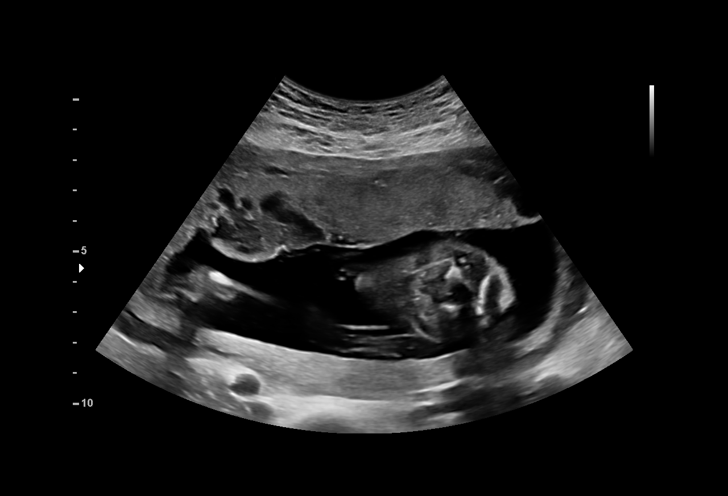
[im 23/124]
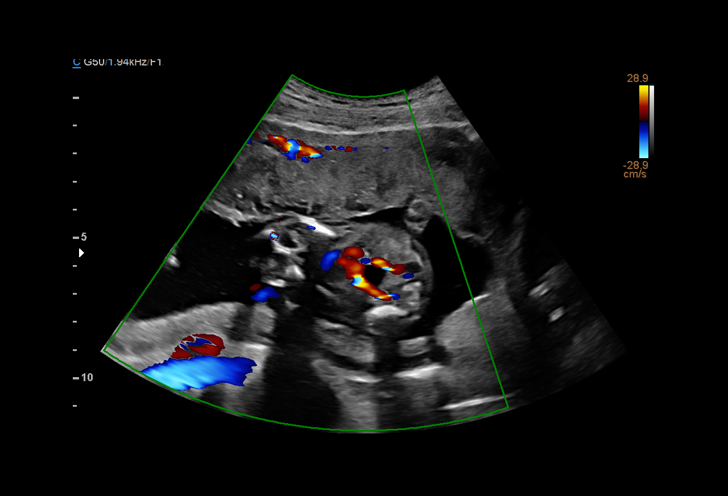
[im 32/124]
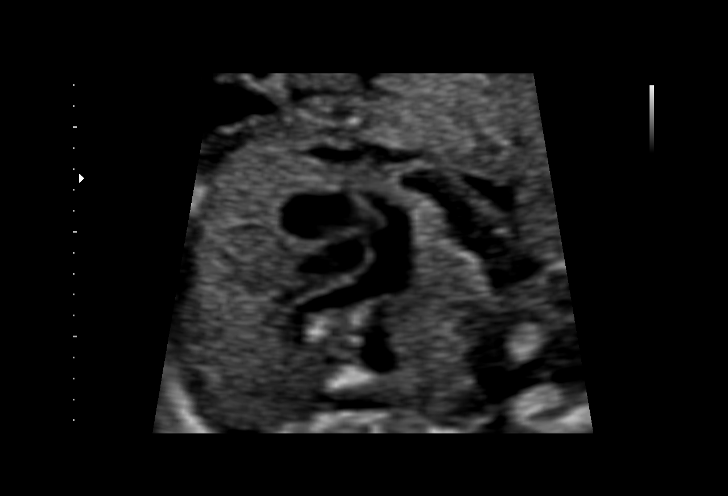
[im 42/124]
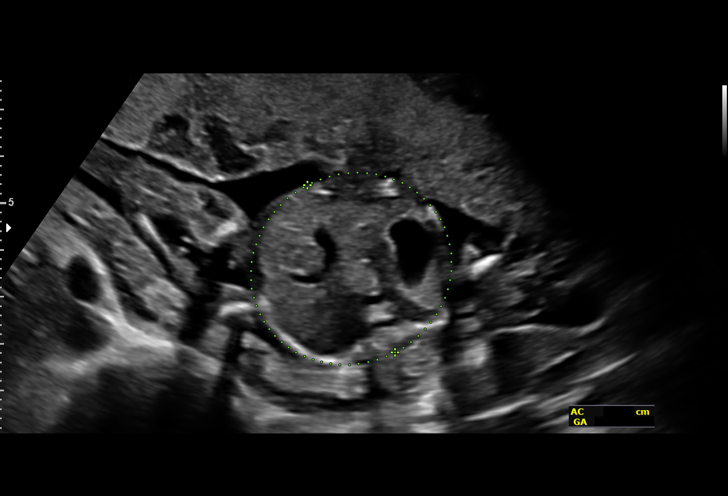
[im 51/124]
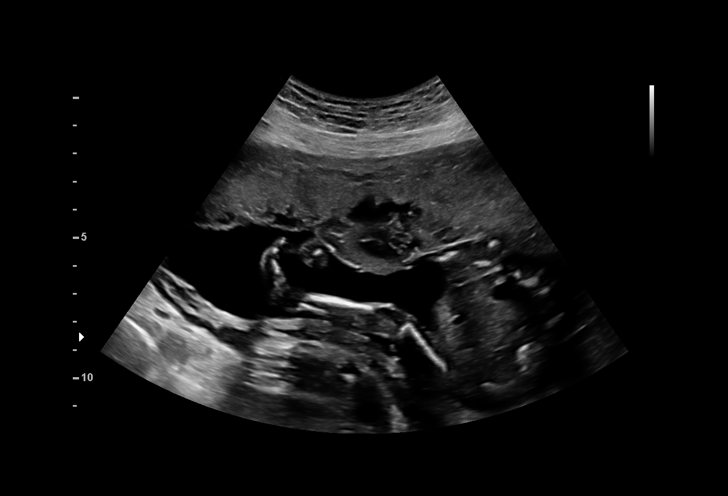
[im 60/124]
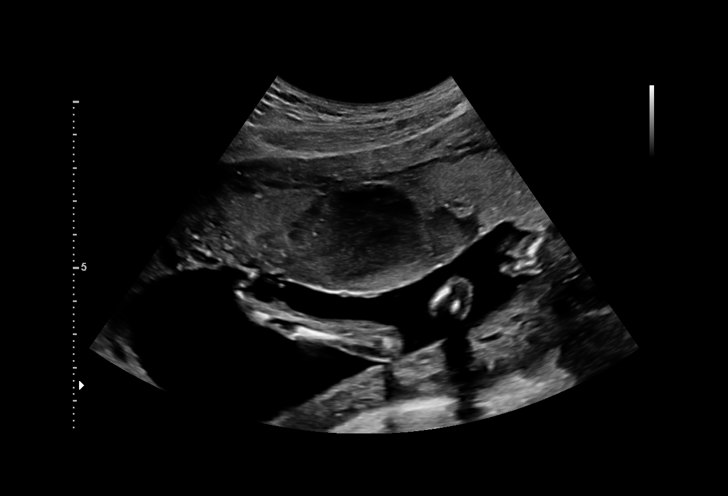
[im 69/124]
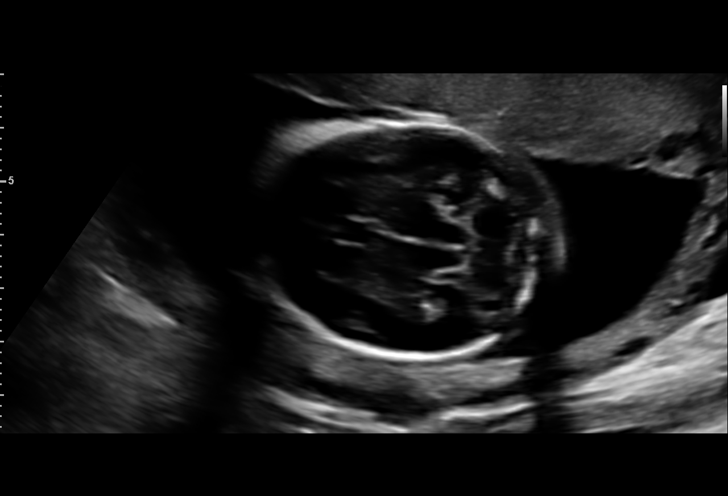
[im 78/124]
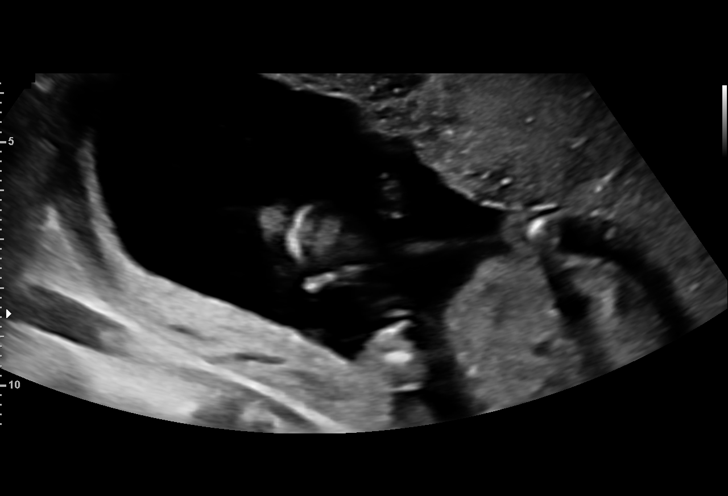
[im 87/124]
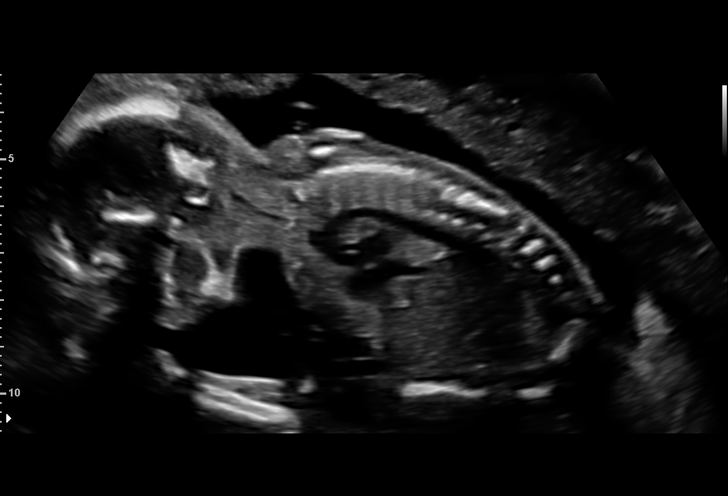
[im 96/124]
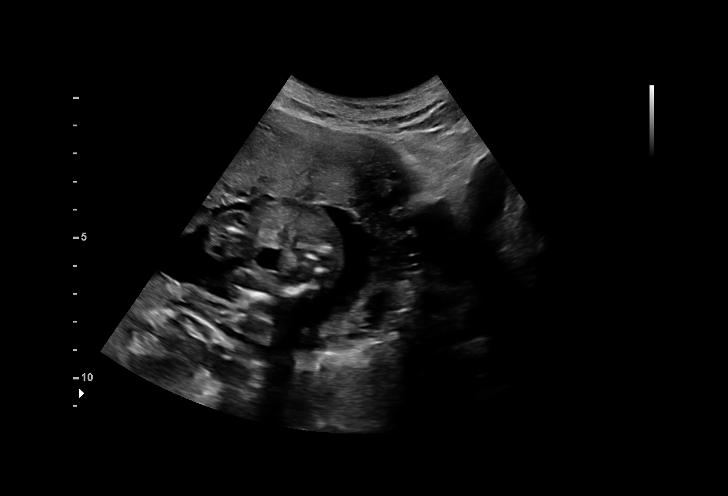
[im 105/124]
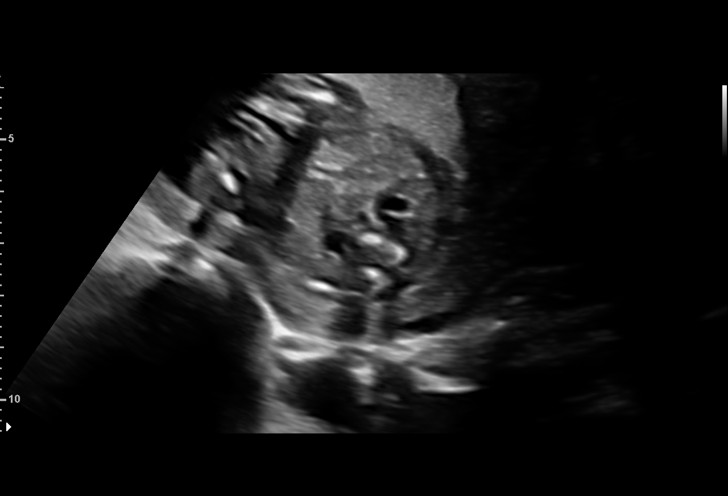
[im 114/124]
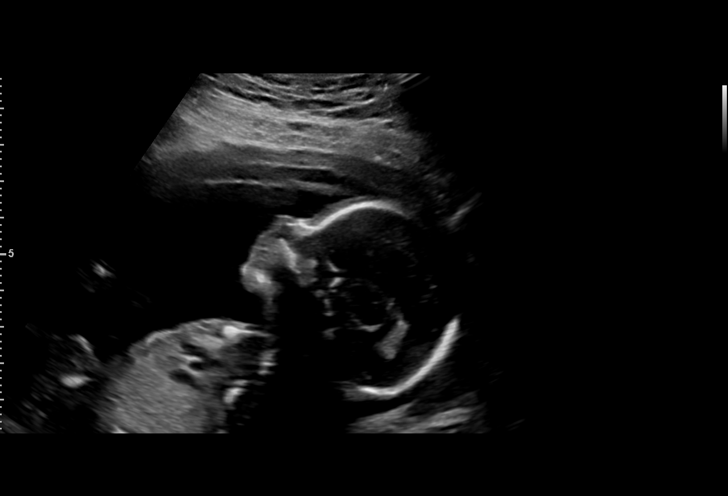
[im 124/124]
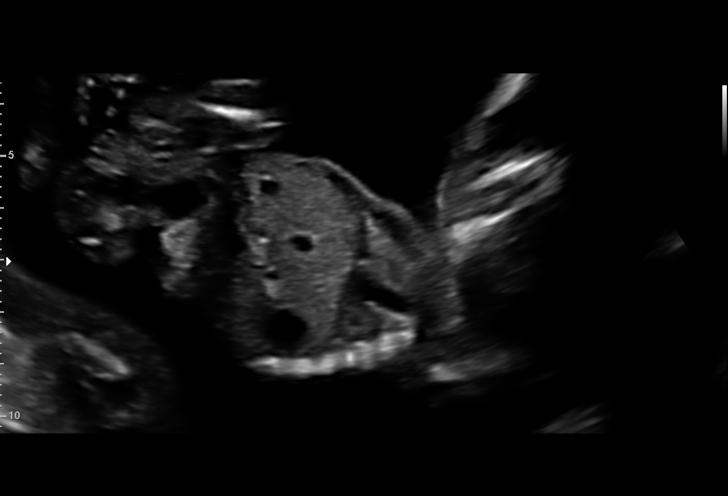

[14 of 28 positions shown; findings below may reference images not displayed]

Health
MOATSHE NP

1  MAYUMI                [id number]      [id number]     [id number]
MOATSHE
Indications

19 weeks gestation of pregnancy
Encounter for fetal anatomic survey
OB History

Blood Type:            Height:  5'5"   Weight (lb):  131      BMI:
Gravidity:    3         Term:   1
TOP:          1        Living:  1
Fetal Evaluation

Num Of Fetuses:     1
Fetal Heart         153
Rate(bpm):
Cardiac Activity:   Observed
Presentation:       Breech
Placenta:           Anterior, above cervical os
P. Cord Insertion:  Visualized

Amniotic Fluid
AFI FV:      Subjectively within normal limits

Largest Pocket(cm)
4.95
Biometry

BPD:      43.1  mm     G. Age:  19w 0d         28  %    CI:        74.33   %   70 - 86
FL/HC:      19.2   %   16.8 -
HC:      158.7  mm     G. Age:  18w 5d         10  %    HC/AC:      1.19       1.09 -
AC:      133.5  mm     G. Age:  18w 6d         23  %    FL/BPD:     70.5   %
FL:       30.4  mm     G. Age:  19w 3d         37  %    FL/AC:      22.8   %   20 - 24
HUM:      29.8  mm     G. Age:  19w 6d         57  %
CER:      20.2  mm     G. Age:  19w 1d         43  %
NFT:       5.4  mm
CM:        3.1  mm

Est. FW:     272  gm    0 lb 10 oz      37  %
Gestational Age

LMP:           21w 5d       Date:   12/26/16                 EDD:   10/02/17
Clinical EDD:  19w 4d                                        EDD:   10/17/17
U/S Today:     19w 0d                                        EDD:   10/21/17
Best:          19w 4d    Det. By:   Clinical EDD             EDD:   10/17/17
Anatomy

Cranium:               Appears normal         Aortic Arch:            Appears normal
Cavum:                 Appears normal         Ductal Arch:            Appears normal
Ventricles:            Appears normal         Diaphragm:              Appears normal
Choroid Plexus:        Appears normal         Stomach:                Appears normal, left
sided
Cerebellum:            Appears normal         Abdomen:                Appears normal
Posterior Fossa:       Appears normal         Abdominal Wall:         Appears nml (cord
insert, abd wall)
Nuchal Fold:           Appears normal         Cord Vessels:           Appears normal (3
vessel cord)
Face:                  Appears normal         Kidneys:                Appear normal
(orbits and profile)
Lips:                  Appears normal         Bladder:                Appears normal
Thoracic:              Appears normal         Spine:                  Not well visualized
Heart:                 Appears normal         Upper Extremities:      Appears normal
(4CH, axis, and situs
RVOT:                  Appears normal         Lower Extremities:      Appears normal
LVOT:                  Appears normal

Other:  Fetus appears to be a female. Heels and 5th digit visualized.
Technically difficult due to fetal position.
Cervix Uterus Adnexa

Cervix
Length:           3.71  cm.
Normal appearance by transabdominal scan.

Uterus
No abnormality visualized.

Left Ovary
Not visualized.

Right Ovary
Not visualized.
Impression

Singleton intrauterine pregnancy at 19+4 weeks
Review of the anatomy shows no sonographic markers for
aneuploidy or structural anomalies
Amniotic fluid volume is normal
Estimated fetal weight is 272g which is growth in the 37th
percentile
Recommendations

Follow-up ultrasounds as clinically indicated.

## 2019-06-28 ENCOUNTER — Other Ambulatory Visit: Payer: Self-pay

## 2019-06-28 DIAGNOSIS — Z20822 Contact with and (suspected) exposure to covid-19: Secondary | ICD-10-CM

## 2019-06-29 ENCOUNTER — Telehealth: Payer: Self-pay | Admitting: *Deleted

## 2019-06-29 LAB — NOVEL CORONAVIRUS, NAA: SARS-CoV-2, NAA: NOT DETECTED

## 2019-06-29 NOTE — Telephone Encounter (Signed)
A friend of the patient was given results, and she requested her results also .They were unavailable at this time ,she states she will wait for someone to call her with her results.

## 2020-01-05 DIAGNOSIS — F4310 Post-traumatic stress disorder, unspecified: Secondary | ICD-10-CM | POA: Diagnosis not present

## 2020-01-05 DIAGNOSIS — F331 Major depressive disorder, recurrent, moderate: Secondary | ICD-10-CM | POA: Diagnosis not present

## 2020-01-20 DIAGNOSIS — F331 Major depressive disorder, recurrent, moderate: Secondary | ICD-10-CM | POA: Diagnosis not present

## 2020-01-20 DIAGNOSIS — F4310 Post-traumatic stress disorder, unspecified: Secondary | ICD-10-CM | POA: Diagnosis not present

## 2020-02-01 IMAGING — DX DG ANKLE COMPLETE 3+V*L*
3 series · 3 of 3 positions shown · non-contrast
Comparison: None.

CLINICAL DATA: Patient states that she slipped and twisted her left
ankle yesterday, continued pain with some lateral soft tissue
swelling. Hx of ankle fracture as a child.

EXAM:
LEFT ANKLE COMPLETE - 3+ VIEW

[ankle ap]
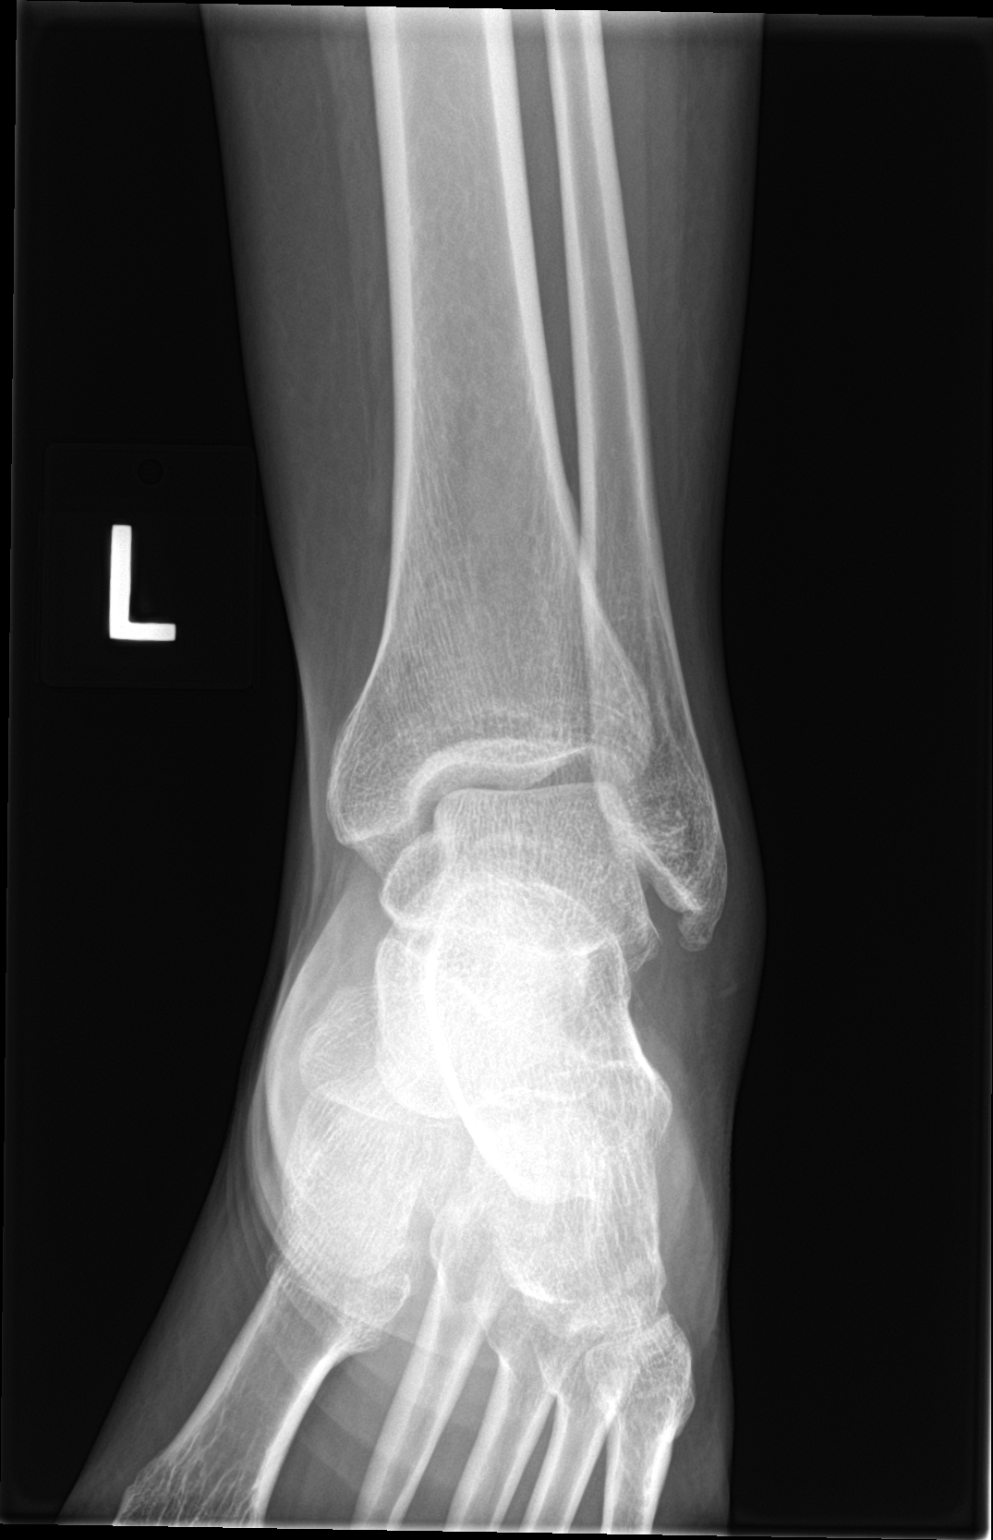

[ankle obl]
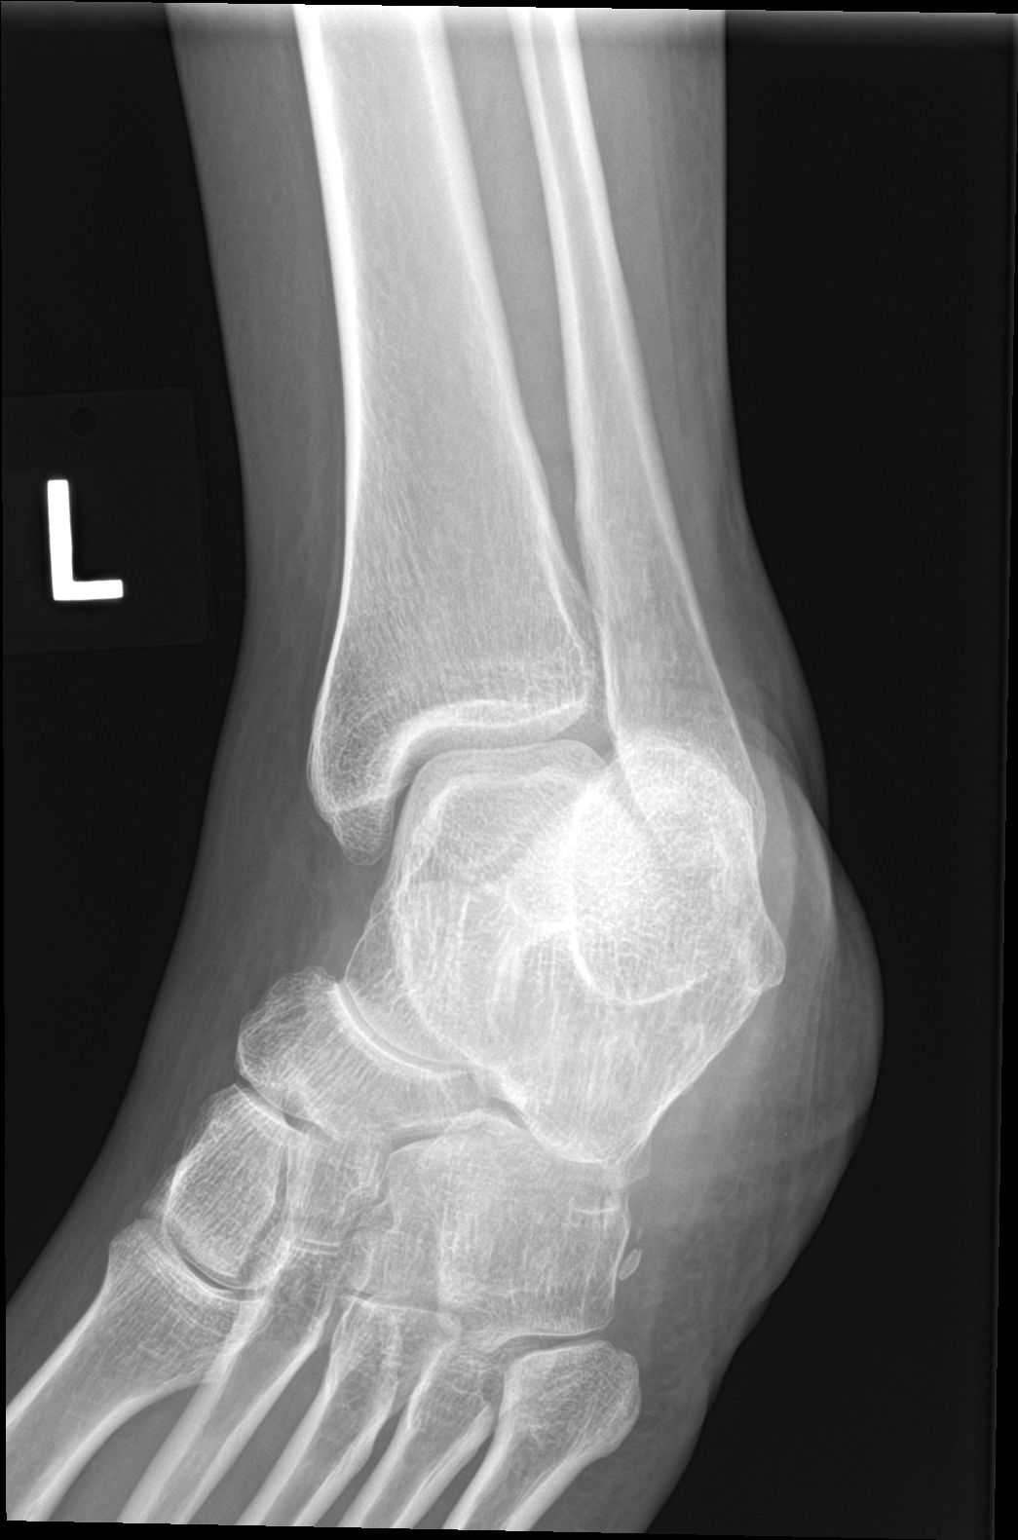

[ankle lat]
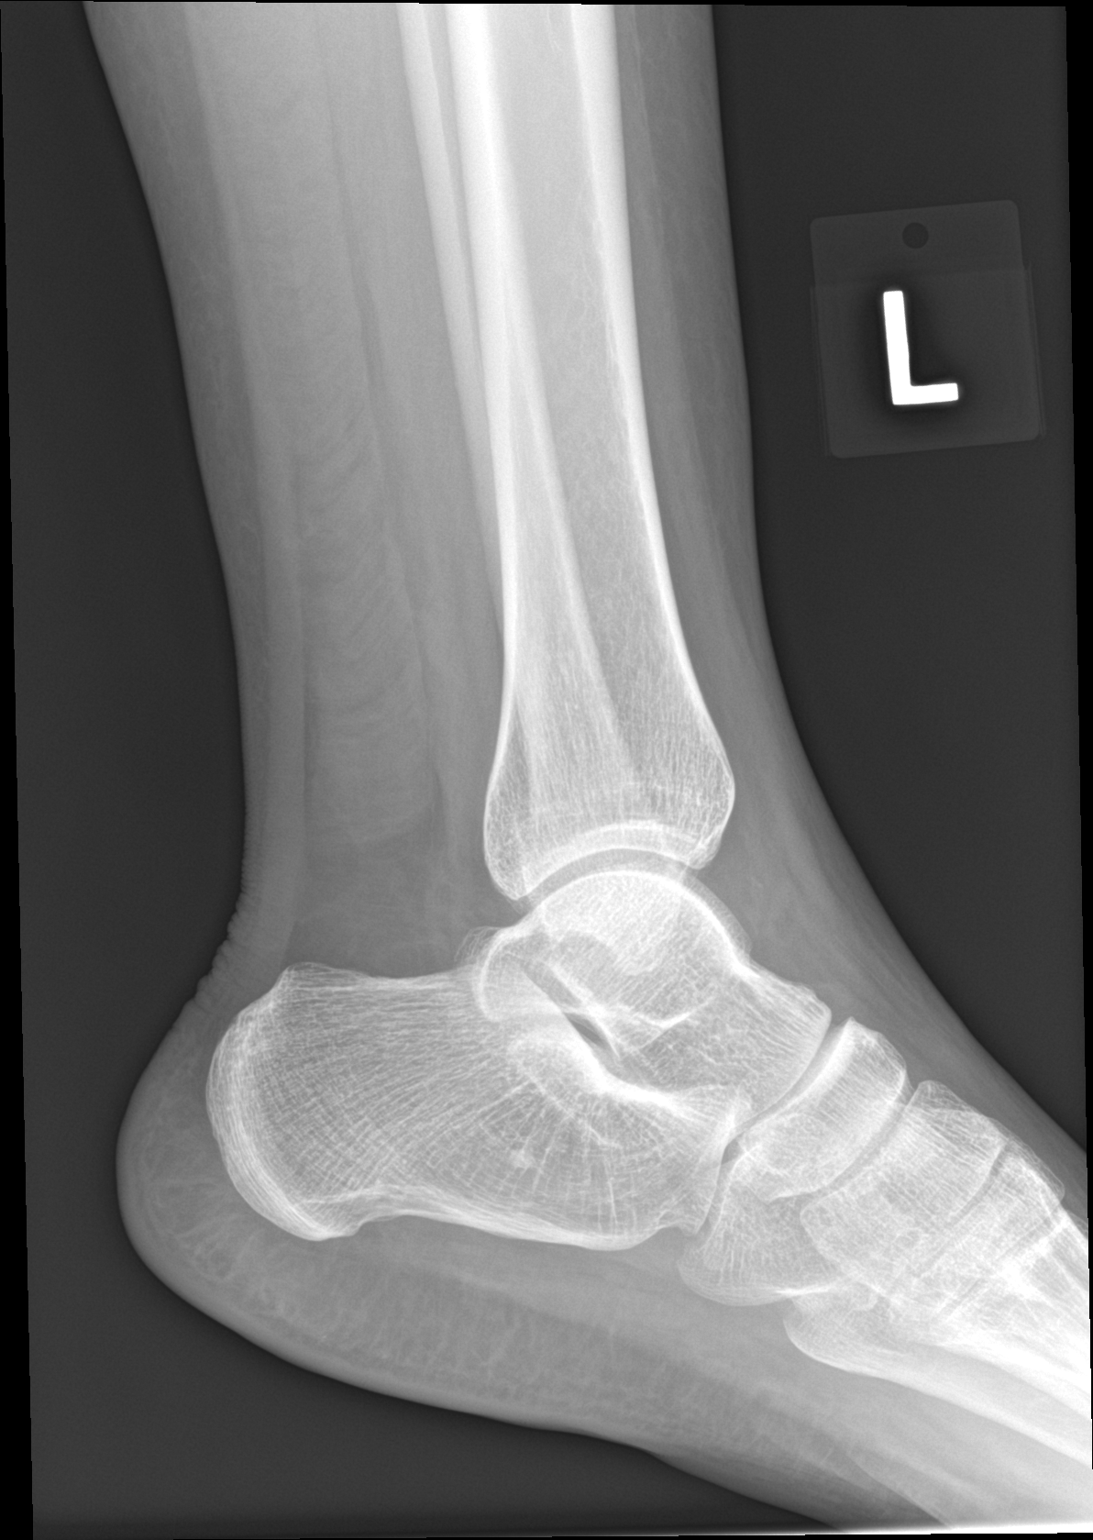

[3 of 3 positions shown; findings below may reference images not displayed]

FINDINGS: No fracture.  No bone lesion.

Ankle mortise is normally spaced and aligned.

Soft tissues are unremarkable.
IMPRESSION: No fracture or dislocation.

## 2020-02-03 DIAGNOSIS — F331 Major depressive disorder, recurrent, moderate: Secondary | ICD-10-CM | POA: Diagnosis not present

## 2020-02-03 DIAGNOSIS — F4310 Post-traumatic stress disorder, unspecified: Secondary | ICD-10-CM | POA: Diagnosis not present

## 2020-03-03 DIAGNOSIS — F331 Major depressive disorder, recurrent, moderate: Secondary | ICD-10-CM | POA: Diagnosis not present

## 2020-03-03 DIAGNOSIS — F4310 Post-traumatic stress disorder, unspecified: Secondary | ICD-10-CM | POA: Diagnosis not present

## 2020-03-20 DIAGNOSIS — W228XXA Striking against or struck by other objects, initial encounter: Secondary | ICD-10-CM | POA: Diagnosis not present

## 2020-03-20 DIAGNOSIS — S060X0A Concussion without loss of consciousness, initial encounter: Secondary | ICD-10-CM | POA: Diagnosis not present

## 2020-03-20 DIAGNOSIS — J029 Acute pharyngitis, unspecified: Secondary | ICD-10-CM | POA: Diagnosis not present

## 2020-03-20 DIAGNOSIS — M67432 Ganglion, left wrist: Secondary | ICD-10-CM | POA: Diagnosis not present

## 2020-06-01 ENCOUNTER — Ambulatory Visit (HOSPITAL_COMMUNITY)
Admission: AD | Admit: 2020-06-01 | Discharge: 2020-06-01 | Disposition: A | Discharge status: Home / Self Care | Payer: Self-pay | Attending: Psychiatry | Admitting: Psychiatry

## 2020-06-01 DIAGNOSIS — Z1330 Encounter for screening examination for mental health and behavioral disorders, unspecified: Secondary | ICD-10-CM | POA: Insufficient documentation

## 2020-06-01 NOTE — BH Assessment (Signed)
Disposition: Per Nira Conn, NP pt has been psych cleared. Resources provided.   Dolores Frame, MSW, LCSW-A Triage Specialist [phone]

## 2020-06-02 NOTE — H&P (Signed)
Behavioral Health Medical Screening Exam  Margaret Spence is an 34 y.o. female.   On evaluation patient is alert and oriented x 4, pleasant, and cooperative. Speech is clear and coherent. Mood is depressed and affect is congruent with mood. She is tearful at times. Thought process is coherent and thought content is logical.  Denies audiovisual hallucinations. No indication that patient is responding to internal stimuli. No evidence of delusional thought content. Denies suicidal ideations. Denies homicidal ideations. Reports daily use of marijuana.  Total Time spent with patient: 20 minutes  Psychiatric Specialty Exam: Physical Exam Constitutional:      General: She is not in acute distress.    Appearance: She is not ill-appearing or toxic-appearing.  Pulmonary:     Effort: Pulmonary effort is normal. No respiratory distress.  Neurological:     Mental Status: She is alert and oriented to person, place, and time.  Psychiatric:        Mood and Affect: Mood is anxious and depressed.        Thought Content: Thought content is not paranoid or delusional. Thought content does not include homicidal or suicidal ideation.    Review of Systems  Constitutional: Negative for activity change, appetite change, chills, diaphoresis, fatigue, fever and unexpected weight change.  Respiratory: Negative for cough and shortness of breath.   Cardiovascular: Negative for chest pain.  Gastrointestinal: Negative for diarrhea, nausea and vomiting.  Musculoskeletal: Negative.   Skin: Negative.   Neurological: Negative for seizures.  Psychiatric/Behavioral: Positive for dysphoric mood and sleep disturbance. Negative for hallucinations, self-injury and suicidal ideas. The patient is nervous/anxious.   All other systems reviewed and are negative.  Blood pressure 117/78, pulse 70, temperature 98.6 F (37 C), temperature source Oral, resp. rate 16, SpO2 100 %.There is no height or weight on file to calculate  BMI. General Appearance: Casual and Neat Eye Contact:  Good Speech:  Clear and Coherent and Normal Rate Volume:  Normal Mood:  Anxious and Depressed Affect:  Congruent and Depressed Thought Process:  Coherent, Goal Directed, Linear and Descriptions of Associations: Intact Orientation:  Full (Time, Place, and Person) Thought Content:  Logical Suicidal Thoughts:  No Homicidal Thoughts:  No Memory:  Immediate;   Good Recent;   Good Judgement:  Good Insight:  Good Psychomotor Activity:  Normal Concentration: Concentration: Good and Attention Span: Good Recall:  Good Fund of Knowledge:Good Language: Good Akathisia:  Negative Handed:  Right AIMS (if indicated):    Assets:  Communication Skills Desire for Improvement Financial Resources/Insurance Housing Physical Health Sleep:      Blood pressure 117/78, pulse 70, temperature 98.6 F (37 C), temperature source Oral, resp. rate 16, SpO2 100 %.  Recommendations: Based on my evaluation the patient does not appear to have an emergency medical condition.   Disposition: No evidence of imminent risk to self or others at present.   Patient does not meet criteria for psychiatric inpatient admission. Supportive therapy provided about ongoing stressors. Discussed crisis plan, support from social network, calling 911, coming to the Emergency Department, and calling Suicide Hotline.  TTS provided with outpatient resources.   Jackelyn Poling, NP 06/02/2020, 12:07 AM

## 2020-06-02 NOTE — BH Assessment (Signed)
Comprehensive Clinical Assessment (CCA) Note  06/02/2020 SHER SHAMPINE [id number]  Visit Diagnosis: F32.2 Major depressive disorder, Single episode, Severe    Disposition: Per Nira Conn, NP pt has been psych cleared. Resources provided.   Margaret Spence is a 34 y.o who voluntarily presents to [facility] via walk in. Pt reported, she has been feeling overwhelmed and experiencing panic attacks. Pt denies any active SI, HI and AVH. Pt reported, not having any support and is emotional drained. Pt denies any access to means.  Pt denies any inpatient treatment, therapist and/or psychiatrist. Pt denies taking any medication at this time. Pt reported, using marijuana daily and being a social drinker.   Pt had normal eye contact, speech and was orient x5. Pt affect, mood and facial expression was depressed. Pt concentration was preoccupied by stressors and allowed anxiety to interfere.   CCA Screening, Triage and Referral (STR)  Patient Reported Information How did you hear about [place]? No data recorded Referral name: No data recorded Referral phone number: No data recorded  Whom do you see for routine medical problems? No data recorded Practice/Facility Name: No data recorded Practice/Facility Phone Number: No data recorded Name of Contact: No data recorded Contact Number: No data recorded Contact Fax Number: No data recorded Prescriber Name: No data recorded Prescriber Address (if known): No data recorded  What Is the Reason for Your Visit/Call Today? Pt stated she has alot of stressors, overwhelmed and been having panic attacks.  How Long Has This Been Causing You Problems? > than 6 months  What Do You Feel Would Help You the Most Today? No data recorded  Have You Recently Been in Any Inpatient Treatment (Hospital/Detox/Crisis Center/28-Day Program)? No  Name/Location of Program/Hospital:No data recorded How Long Were You There? No data recorded When Were You Discharged?  No data recorded  Have You Ever Received Services From [facility] Before? Yes  Who Do You See at [facility]? No data recorded  Have You Recently Had Any Thoughts About Hurting Yourself? No  Are You Planning to Commit Suicide/Harm Yourself At This time? No   Have you Recently Had Thoughts About Hurting Someone Karolee Ohs? No  Explanation: No data recorded  Have You Used Any Alcohol or Drugs in the Past 24 Hours? Yes  How Long Ago Did You Use Drugs or Alcohol? No data recorded What Did You Use and How Much? No data recorded  Do You Currently Have a Therapist/Psychiatrist? No  Name of Therapist/Psychiatrist: No data recorded  Have You Been Recently Discharged From Any Office Practice or Programs? No data recorded Explanation of Discharge From Practice/Program: No data recorded    CCA Screening Triage Referral Assessment Type of Contact: Tele-Assessment  Is this Initial or Reassessment? Initial Assessment  Date Telepsych consult ordered in [facility]:  06/02/20  Time Telepsych consult ordered in [facility]:  No data recorded  Patient Reported Information Reviewed? No data recorded Patient Left Without Being Seen? No data recorded Reason for Not Completing Assessment: No data recorded  Collateral Involvement: No data recorded  Does Patient Have a Court Appointed Legal Guardian? No data recorded Name and Contact of Legal Guardian: No data recorded If Minor and Not Living with Parent(s), Who has Custody? No data recorded Is CPS involved or ever been involved? Never  Is APS involved or ever been involved? Never   Patient Determined To Be At Risk for Harm To Self or Others Based on Review of Patient Reported Information or Presenting Complaint? No  Method: No  data recorded Availability of Means: No data recorded Intent: No data recorded Notification Required: No data recorded Additional Information for Danger to Others Potential: No data recorded Additional Comments for Danger to  Others Potential: No data recorded Are There Guns or Other Weapons in Your Home? No data recorded Types of Guns/Weapons: No data recorded Are These Weapons Safely Secured?                            No data recorded Who Could Verify You Are Able To Have These Secured: No data recorded Do You Have any Outstanding Charges, Pending Court Dates, Parole/Probation? No data recorded Contacted To Inform of Risk of Harm To Self or Others: No data recorded  Location of Assessment: No data recorded  Does Patient Present under Involuntary Commitment? No  IVC Papers Initial File Date: No data recorded  [place]   Patient Currently Receiving the Following Services: Not Receiving Services   Determination of Need: Routine (7 days)   Options For Referral: Outpatient Therapy   CCA Biopsychosocial  Intake/Chief Complaint:  CCA Intake With Chief Complaint CCA Part Two Date: 06/02/20 CCA Part Two Time: 0015 Chief Complaint/Presenting Problem: Pt stated she has alot of stressors, overwhelmed and been having panic attacks. Patient's Currently Reported Symptoms/Problems: crying and panic attacks Individual's Strengths: N/A Individual's Preferences: N/A Individual's Abilities: N/A Type of Services Patient Feels Are Needed:  (Pt was interested in resources) Initial Clinical Notes/Concerns: Pt would benefit from outpatient  Mental Health Symptoms Depression:  Depression: Change in energy/activity, Fatigue, Hopelessness, Increase/decrease in appetite, Irritability, Sleep (too much or little), Tearfulness, Weight gain/loss, Worthlessness, Duration of symptoms greater than two weeks  Mania:  Mania: Change in energy/activity, Irritability, Racing thoughts  Anxiety:   Anxiety: Fatigue, Irritability, Sleep, Worrying  Psychosis:  Psychosis: None  Trauma:  Trauma: None  Obsessions:  Obsessions: None  Compulsions:  Compulsions: None  Inattention:  Inattention: N/A   Hyperactivity/Impulsivity:  Hyperactivity/Impulsivity: N/A  Oppositional/Defiant Behaviors:  Oppositional/Defiant Behaviors: Easily annoyed  Emotional Irregularity:  Emotional Irregularity: Intense/inappropriate anger, Chronic feelings of emptiness  Other Mood/Personality Symptoms:      Mental Status Exam Appearance and self-care  Stature:  Stature: Average  Weight:  Weight: Thin  Clothing:  Clothing: Age-appropriate  Grooming:  Grooming: Normal  Cosmetic use:  Cosmetic Use: None  Posture/gait:  Posture/Gait: Normal  Motor activity:  Motor Activity: Not Remarkable  Sensorium  Attention:  Attention: Normal  Concentration:  Concentration: Anxiety interferes, Preoccupied  Orientation:  Orientation: X5  Recall/memory:  Recall/Memory: Normal  Affect and Mood  Affect:  Affect: Depressed  Mood:  Mood: Depressed  Relating  Eye contact:  Eye Contact: Normal  Facial expression:  Facial Expression: Depressed, Sad  Attitude toward examiner:  Attitude Toward Examiner: Cooperative  Thought and Language  Speech flow: Speech Flow: Normal  Thought content:  Thought Content: Appropriate to Mood and Circumstances  Preoccupation:  Preoccupations:  (stressors)  Hallucinations:  Hallucinations: None  Organization:     Company secretaryxecutive Functions  Fund of Knowledge:  Fund of Knowledge:  Industrial/product designer(UTA)  Intelligence:  Intelligence:  Industrial/product designer(UTA)  Abstraction:  Abstraction:  Industrial/product designer(UTA)  Judgement:  Judgement: Impaired  Reality Testing:  Reality Testing:  (UTA)  Insight:  Insight: Lacking  Decision Making:  Decision Making: Normal  Social Functioning  Social Maturity:  Social Maturity: Isolates  Social Judgement:  Social Judgement:  (UTA)  Stress  Stressors:  Stressors: Family conflict, Work, Other (Comment) (  Covid)  Coping Ability:  Coping Ability: Overwhelmed  Skill Deficits:  Skill Deficits: Self-control  Supports:  Supports:  (Pt stated she has her bf, but feel alone and its not enough)      Religion: Religion/Spirituality Are You A Religious Person?: No How Might This Affect Treatment?:  (UTA)  Leisure/Recreation: Leisure / Recreation Do You Have Hobbies?:  (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)  Exercise/Diet: Exercise/Diet Do You Exercise?: No Have You Gained or Lost A Significant Amount of Weight in the Past Six Months?:  (Pt stated she has lost 45 pounds since the birth of her daughter (79 y.o)) Do You Follow a Special Diet?: No Do You Have Any Trouble Sleeping?: Yes (Pt stated she only gets 4 hrs) Explanation of Sleeping Difficulties:  (Pt stated she only gets 4 hrs)   CCA Employment/Education  Employment/Work Situation: Employment / Work Situation Employment situation: Employed 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) Patient's job has been impacted by current illness: Yes Describe how patient's job has been impacted: Pt stated she has been irritable at work and taking it out on customers What is the longest time patient has a held a job?:  (UTA) Where was the patient employed at that time?:  (UTA) Has patient ever been in the [employer]?:  Industrial/product designer)  Education: Education Is Patient Currently Attending School?: No Last Grade Completed:  (some college) Name of High School:  (UTA) Did Garment/textile technologist From [employer]?: Yes Did You Attend College?: Yes (pt stated some college) What Type of College Degree Do you Have?:  (UTA) Did You Attend Graduate School?: No What Was Your Major?:  (N/A) Did You Have Any Special Interests In School?:  (UTA) Did You Have An Individualized Education Program (IIEP):  (UTA) Did You Have Any Difficulty At School?:  (UTA) Patient's Education Has Been Impacted by Current Illness:  (UTA)   CCA Family/Childhood History  Family and Relationship History: Family history Marital status: Single Are you sexually active?:  (UTA) What is your sexual orientation?:  (UTA) Has your sexual  activity been affected by drugs, alcohol, medication, or emotional stress?:  (UTA) Does patient have children?: Yes How many children?: 2 (plus one bouns daughter) How is patient's relationship with their children?:  (UTA)  Childhood History:  Childhood History By whom was/is the patient raised?:  (UTA) Additional childhood history information:  (UTA) Description of patient's relationship with caregiver when they were a child:  (UTA) Patient's description of current relationship with people who raised him/her:  (UTA) How were you disciplined when you got in trouble as a child/adolescent?:  (UTA) Does patient have siblings?:  (UTA) Did patient suffer any verbal/emotional/physical/sexual abuse as a child?: No Did patient suffer from severe childhood neglect?: No Has patient ever been sexually abused/assaulted/raped as an adolescent or adult?: No Was the patient ever a victim of a crime or a disaster?:  (UTA) Witnessed domestic violence?: No Has patient been affected by domestic violence as an adult?: No  Child/Adolescent Assessment:     CCA Substance Use  Alcohol/Drug Use: Alcohol / Drug Use Pain Medications: Pt denies Prescriptions: Pt denies Over the Counter: Pt denies History of alcohol / drug use?: Yes Longest period of sobriety (when/how long):  (UTA) Negative Consequences of Use:  (UTA) Withdrawal Symptoms:  (UTA) Substance #1 Name of Substance 1: Marijuana 1 - Age of First Use: 19 1 - Amount (size/oz): 2 grams 1 - Frequency: daily 1 - Last  Use / Amount: 05/31/20       ASAM's:  Six Dimensions of Multidimensional Assessment  Dimension 1:  Acute Intoxication and/or Withdrawal Potential:   Dimension 1:  Description of individual's past and current experiences of substance use and withdrawal:  (UTA)  Dimension 2:  Biomedical Conditions and Complications:   Dimension 2:  Description of patient's biomedical conditions and  complications:  (UTA)  Dimension 3:   Emotional, Behavioral, or Cognitive Conditions and Complications:  Dimension 3:  Description of emotional, behavioral, or cognitive conditions and complications:  (UTA)  Dimension 4:  Readiness to Change:  Dimension 4:  Description of Readiness to Change criteria:  (UTA)  Dimension 5:  Relapse, Continued use, or Continued Problem Potential:  Dimension 5:  Relapse, continued use, or continued problem potential critiera description:  (UTA)  Dimension 6:  Recovery/Living Environment:  Dimension 6:  Recovery/Iiving environment criteria description:  (UTA)  ASAM Severity Score:    ASAM Recommended Level of Treatment: ASAM Recommended Level of Treatment:  (UTA)   Substance use Disorder (SUD) Substance Use Disorder (SUD)  Checklist Symptoms of Substance Use:  (UTA)  Recommendations for Services/Supports/Treatments: Recommendations for Services/Supports/Treatments Recommendations For Services/Supports/Treatments: Individual Therapy  DSM5 Diagnoses: Patient Active Problem List   Diagnosis Date Noted  . Rh negative status during pregnancy 10/23/2017  . Rubella nonimmune status, delivered, current hospitalization 10/23/2017  . Normal labor 10/21/2017    Patient Centered Plan: Patient is on the following Treatment Plan(s):    Referrals to Alternative Service(s): Referred to Alternative Service(s):   Place:   Date:   Time:    Referred to Alternative Service(s):   Place:   Date:   Time:    Referred to Alternative Service(s):   Place:   Date:   Time:    Referred to Alternative Service(s):   Place:   Date:   Time:     Dolores Frame, MSW, LCSW-A Triage Specialist [phone]

## 2020-06-07 ENCOUNTER — Other Ambulatory Visit: Payer: Self-pay

## 2020-06-07 ENCOUNTER — Ambulatory Visit (HOSPITAL_COMMUNITY)
Admission: EM | Admit: 2020-06-07 | Discharge: 2020-06-08 | Disposition: A | Discharge status: Home / Self Care | Payer: BC Managed Care – PPO | Attending: Nurse Practitioner | Admitting: Nurse Practitioner

## 2020-06-07 ENCOUNTER — Encounter (HOSPITAL_COMMUNITY): Payer: Self-pay

## 2020-06-07 DIAGNOSIS — R45851 Suicidal ideations: Secondary | ICD-10-CM | POA: Diagnosis not present

## 2020-06-07 DIAGNOSIS — R450 Nervousness: Secondary | ICD-10-CM | POA: Diagnosis not present

## 2020-06-07 DIAGNOSIS — G4700 Insomnia, unspecified: Secondary | ICD-10-CM | POA: Insufficient documentation

## 2020-06-07 DIAGNOSIS — F1290 Cannabis use, unspecified, uncomplicated: Secondary | ICD-10-CM | POA: Insufficient documentation

## 2020-06-07 DIAGNOSIS — F322 Major depressive disorder, single episode, severe without psychotic features: Secondary | ICD-10-CM

## 2020-06-07 DIAGNOSIS — F419 Anxiety disorder, unspecified: Secondary | ICD-10-CM | POA: Diagnosis not present

## 2020-06-07 DIAGNOSIS — F332 Major depressive disorder, recurrent severe without psychotic features: Secondary | ICD-10-CM | POA: Diagnosis not present

## 2020-06-07 DIAGNOSIS — Z20822 Contact with and (suspected) exposure to covid-19: Secondary | ICD-10-CM | POA: Insufficient documentation

## 2020-06-07 LAB — CBC WITH DIFFERENTIAL/PLATELET
Abs Immature Granulocytes: 0.01 10*3/uL (ref 0.00–0.07)
Basophils Absolute: 0 10*3/uL (ref 0.0–0.1)
Basophils Relative: 0 %
Eosinophils Absolute: 0.1 10*3/uL (ref 0.0–0.5)
Eosinophils Relative: 1 %
HCT: 37.3 % (ref 36.0–46.0)
Hemoglobin: 11.9 g/dL — ABNORMAL LOW (ref 12.0–15.0)
Immature Granulocytes: 0 %
Lymphocytes Relative: 49 %
Lymphs Abs: 3.3 10*3/uL (ref 0.7–4.0)
MCH: 30.5 pg (ref 26.0–34.0)
MCHC: 31.9 g/dL (ref 30.0–36.0)
MCV: 95.6 fL (ref 80.0–100.0)
Monocytes Absolute: 0.3 10*3/uL (ref 0.1–1.0)
Monocytes Relative: 5 %
Neutro Abs: 3.1 10*3/uL (ref 1.7–7.7)
Neutrophils Relative %: 45 %
Platelets: 363 10*3/uL (ref 150–400)
RBC: 3.9 MIL/uL (ref 3.87–5.11)
RDW: 12.8 % (ref 11.5–15.5)
WBC: 6.9 10*3/uL (ref 4.0–10.5)
nRBC: 0 % (ref 0.0–0.2)

## 2020-06-07 LAB — COMPREHENSIVE METABOLIC PANEL
ALT: 16 U/L (ref 0–44)
AST: 21 U/L (ref 15–41)
Albumin: 4 g/dL (ref 3.5–5.0)
Alkaline Phosphatase: 80 U/L (ref 38–126)
Anion gap: 10 (ref 5–15)
BUN: 8 mg/dL (ref 6–20)
CO2: 25 mmol/L (ref 22–32)
Calcium: 8.9 mg/dL (ref 8.9–10.3)
Chloride: 102 mmol/L (ref 98–111)
Creatinine, Ser: 0.73 mg/dL (ref 0.44–1.00)
GFR calc Af Amer: >60 mL/min (ref >60)
GFR calc non Af Amer: >60 mL/min (ref >60)
Glucose, Bld: 105 mg/dL — ABNORMAL HIGH (ref 70–99)
Potassium: 3.4 mmol/L — ABNORMAL LOW (ref 3.5–5.1)
Sodium: 137 mmol/L (ref 135–145)
Total Bilirubin: 0.5 mg/dL (ref 0.3–1.2)
Total Protein: 7.3 g/dL (ref 6.5–8.1)

## 2020-06-07 LAB — POCT URINE DRUG SCREEN - MANUAL ENTRY (I-SCREEN)
POC Amphetamine UR: NOT DETECTED
POC Cocaine UR: NOT DETECTED
POC Marijuana UR: POSITIVE — AB
POC Methadone UR: NOT DETECTED
POC Morphine: NOT DETECTED
POC Oxazepam (BZO): NOT DETECTED
POC Oxycodone UR: NOT DETECTED

## 2020-06-07 LAB — POCT URINE DRUG SCREEN - MANUAL ENTRY (I-CUP)
POC Buprenorphine (BUP): NOT DETECTED
POC Methamphetamine UR: NOT DETECTED
POC Secobarbital (BAR): NOT DETECTED

## 2020-06-07 LAB — POCT PREGNANCY, URINE: Preg Test, Ur: NEGATIVE

## 2020-06-07 LAB — TSH: TSH: 2.553 u[IU]/mL (ref 0.350–4.500)

## 2020-06-07 LAB — POC SARS CORONAVIRUS 2 AG -  ED: SARS Coronavirus 2 Ag: NEGATIVE

## 2020-06-07 MED ORDER — MAGNESIUM HYDROXIDE 400 MG/5ML PO SUSP: 30.0000 mL | Freq: Every day | ORAL | Status: DC | PRN

## 2020-06-07 MED ORDER — ALUM & MAG HYDROXIDE-SIMETH 200-200-20 MG/5ML PO SUSP: 30.0000 mL | ORAL | Status: DC | PRN

## 2020-06-07 MED ORDER — ACETAMINOPHEN 325 MG PO TABS: 650.0000 mg | ORAL_TABLET | Freq: Four times a day (QID) | ORAL | Status: DC | PRN

## 2020-06-07 MED ORDER — POTASSIUM CHLORIDE CRYS ER 20 MEQ PO TBCR
20.0000 meq | EXTENDED_RELEASE_TABLET | Freq: Once | ORAL | Status: AC
  Administered 2020-06-08: 20 meq via ORAL
  Filled 2020-06-07: qty 1

## 2020-06-07 MED ORDER — HYDROXYZINE HCL 25 MG PO TABS
25.0000 mg | ORAL_TABLET | Freq: Three times a day (TID) | ORAL | Status: DC | PRN
  Administered 2020-06-08: 25 mg via ORAL
  Filled 2020-06-07: qty 1

## 2020-06-07 NOTE — BH Assessment (Signed)
Comprehensive Clinical Assessment (CCA) Screening, Triage and Referral Note  06/07/2020 Margaret Spence [id number]   Patient presenting as a walk-in to [facility] due to fleeting thoughts of SI with plan to take pills. Patient reported worsening depression and feeling overwhelmed. Patient was seen on 06/01/20 at [facility], TTS assessment completed and patient was psych cleared, discharged with resources. 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 Patient reported job related stressors. Patient reported stressors and feeling overwhelmed since 2 years ago when after giving birth to her 32 year old with fiance, the fiances 32 year old moved in with them also. Patient reported not having a support system and no one understands what she is going through. Patient reported feelings of "everybody better off without me" and "life better off if I didn't have live it". Patient denied HI, psychosis and alcohol/drug usage.  Patient reported that she saw her therapist on last week and that her next therapy appointment is on 06/19/2020 with therapist Margaret Spence. Patient denied being on psych medications. Patient denied history of psych inpatient, suicide attempts, self-harming behaviors and alcohol/drug usage.   Disposition Nira Conn, NP, recommends continual observation for safety and stabilization with psych reassessment in the AM.  Visit Diagnosis: Major depressive disorder  Patient Reported Information How did you hear about [place]? Self   Referral name: No data recorded  Referral phone number: No data recorded Whom do you see for routine medical problems? I don't have a doctor   Practice/Facility Name: No data recorded  Practice/Facility Phone Number: No data recorded  Name of Contact: No data recorded  Contact Number: No data recorded  Contact Fax Number: No data recorded  Prescriber Name: No data recorded  Prescriber Address (if known): No data  recorded What Is the Reason for Your Visit/Call Today? Suicidal thoughts and increased depression  How Long Has This Been Causing You Problems? > than 6 months  Have You Recently Been in Any Inpatient Treatment (Hospital/Detox/Crisis Center/28-Day Program)? No   Name/Location of Program/Hospital:No data recorded  How Long Were You There? No data recorded  When Were You Discharged? No data recorded Have You Ever Received Services From [facility] Before? No   Who Do You See at [facility]? No data recorded Have You Recently Had Any Thoughts About Hurting Yourself? Yes   Are You Planning to Commit Suicide/Harm Yourself At This time?  No  Have you Recently Had Thoughts About Hurting Someone Karolee Ohs? No   Explanation: No data recorded Have You Used Any Alcohol or Drugs in the Past 24 Hours? No   How Long Ago Did You Use Drugs or Alcohol?  No data recorded  What Did You Use and How Much? No data recorded What Do You Feel Would Help You the Most Today? Other (Comment) ("not sure")  Do You Currently Have a Therapist/Psychiatrist? Yes   Name of Therapist/Psychiatrist: Salome Spence, therapist   Have You Been Recently Discharged From Any Office Practice or Programs? No   Explanation of Discharge From Practice/Program:  No data recorded    CCA Screening Triage Referral Assessment Type of Contact: Face-to-Face   Is this Initial or Reassessment? Initial Assessment   Date Telepsych consult ordered in [facility]:  06/02/20   Time Telepsych consult ordered in [facility]:  No data recorded Patient Reported Information Reviewed? Yes   Patient Left Without Being Seen? No data recorded  Reason for Not Completing Assessment: No data recorded Collateral Involvement:  none reported  Does Patient Have a Automotive engineer Guardian? No data recorded  Name and Contact of Legal Guardian:  No data recorded If Minor and Not Living with Parent(s), Who has Custody? No data recorded Is CPS involved or ever been  involved? Never  Is APS involved or ever been involved? Never  Patient Determined To Be At Risk for Harm To Self or Others Based on Review of Patient Reported Information or Presenting Complaint? Yes, for Self-Harm   Method: No data recorded  Availability of Means: No data recorded  Intent: No data recorded  Notification Required: No data recorded  Additional Information for Danger to Others Potential:  No data recorded  Additional Comments for Danger to Others Potential:  No data recorded  Are There Guns or Other Weapons in Your Home?  No data recorded   Types of Guns/Weapons: No data recorded   Are These Weapons Safely Secured?                              No data recorded   Who Could Verify You Are Able To Have These Secured:    No data recorded Do You Have any Outstanding Charges, Pending Court Dates, Parole/Probation? No data recorded Contacted To Inform of Risk of Harm To Self or Others: No data recorded Location of Assessment: [facility] Services  Does Patient Present under Involuntary Commitment? No   IVC Papers Initial File Date: No data recorded  [place] of Residence: [place]  Patient Currently Receiving the Following Services: Individual Therapy   Determination of Need: Urgent (48 hours)   Options For Referral: Other: Comment (Continual observation)   Burnetta Sabin, [facility]

## 2020-06-07 NOTE — ED Notes (Signed)
Patient,keys, phone, ear pods, wallet, and a book in a lady's backpack are stored in locker number 25

## 2020-06-07 NOTE — ED Provider Notes (Addendum)
Behavioral Health Admission H&P [facility] & OBS)  Date: 06/08/20 Patient Name: Margaret Spence MRN: [id number] Chief Complaint:  Chief Complaint  Patient presents with  . Suicidal  . Anxiety  . Depression      Diagnoses:  Final diagnoses:  Severe major depression, single episode, without psychotic features (HCC)    HPI: Margaret Spence is a 34 y.o. female who presents to [facility] due to worsening depression, anxiety, and feeling overwhelmed. Patient was evaluated as a walk-in on 06/01/2020 at behavioral health hospital and was discharged with outpatient resources. Patient saw a therapist, [name], last week, but she was unable to schedule another appointment until 06/19/2020. Patient reports that she has been feeling overwhelmed since the birth of her youngest child 2 years ago. States that she has been having suicidal ideations with thoughts of overdosing on sleeping pills.  States that she has been having these thoughts for the past couple of years and has never acted on these thoughts. States that she has three children and he responsibility and love for them is what keeps her from harming herself. She denies current suicidal ideations. She is able to verbally contract for safety. She denies homicidal ideations.  On evaluation patient is alert and oriented x 4, pleasant, and cooperative. Speech is clear and coherent. Mood is depressed and anxious. Affect is congruent with mood. Thought process is coherent and thought content is logical. Denies audiovisual hallucinations. No indication that patient is responding to internal stimuli. No evidence of delusional thought content. States that she has been having suicidal ideations with thoughts of overdosing on sleeping pills.  States that she has been having these thoughts for the past couple of years and has never acted on these thoughts. Denies current suicidal ideations. Denies homicidal ideations. Reports that she uses marijuana regularly.  Denies use of other substances.    PHQ 2-9:     ED from 06/07/2020 in [facility]  C-SSRS RISK CATEGORY Low Risk       Total Time spent with patient: 30 minutes  Musculoskeletal  Strength & Muscle Tone: within normal limits Gait & Station: normal Patient leans: N/A  Psychiatric Specialty Exam  Presentation General Appearance: Appropriate for Environment;Well Groomed  Eye Contact:Good  Speech:Clear and Coherent;Normal Rate  Speech Volume:Normal  Handedness:No data recorded  Mood and Affect  Mood:Anxious;Depressed  Affect:Congruent;Depressed   Thought Process  Thought Processes:Coherent;Goal Directed;Linear  Descriptions of Associations:Intact  Orientation:Full (Time, Place and Person)  Thought Content:WDL  Hallucinations:Hallucinations: None  Ideas of Reference:None  Suicidal Thoughts:Suicidal Thoughts: Yes, Passive  Homicidal Thoughts:Homicidal Thoughts: No   Sensorium  Memory:Immediate Good;Recent Good;Remote Good  Judgment:Fair  Insight:Fair   Executive Functions  Concentration:Good  Attention Span:Good  Recall:Good  Fund of Knowledge:Good  Language:Good   Psychomotor Activity  Psychomotor Activity:Psychomotor Activity: Normal   Assets  Assets:Communication Skills;Desire for Improvement;Financial Resources/Insurance;Housing;Physical Health   Sleep  Sleep:Sleep: Fair   Physical Exam Constitutional:      General: She is not in acute distress.    Appearance: She is obese. She is not ill-appearing, toxic-appearing or diaphoretic.  HENT:     Head: Normocephalic.     Right Ear: External ear normal.     Left Ear: External ear normal.  Eyes:     Conjunctiva/sclera: Conjunctivae normal.     Pupils: Pupils are equal, round, and reactive to light.  Cardiovascular:     Rate and Rhythm: Normal rate.  Pulmonary:     Effort: Pulmonary effort is normal. No  respiratory distress.  Musculoskeletal:         General: Normal range of motion.  Neurological:     Mental Status: She is alert and oriented to person, place, and time.  Psychiatric:        Mood and Affect: Mood is anxious and depressed.        Thought Content: Thought content is not paranoid or delusional. Thought content includes suicidal ideation. Thought content does not include homicidal ideation. Thought content includes suicidal plan.    Review of Systems  Constitutional: Negative for chills, diaphoresis, fever, malaise/fatigue and weight loss.  Respiratory: Negative for cough and shortness of breath.   Cardiovascular: Negative for chest pain.  Neurological: Negative for dizziness.  Psychiatric/Behavioral: Positive for depression and suicidal ideas. Negative for hallucinations and memory loss. The patient is nervous/anxious and has insomnia.     Blood pressure 90/66, pulse 64, temperature 97.7 F (36.5 C), temperature source Tympanic, resp. rate 16, height 5\' 5"  (1.651 m), weight 131 lb (59.4 kg), SpO2 100 %. Body mass index is 21.8 kg/m.  Past Psychiatric History: MDD  Is the patient at risk to self? Yes  Has the patient been a risk to self in the past 6 months? No .    Has the patient been a risk to self within the distant past? No   Is the patient a risk to others? No   Has the patient been a risk to others in the past 6 months? No   Has the patient been a risk to others within the distant past? No   Past Medical History: History reviewed. No pertinent past medical history.  Past Surgical History:  Procedure Laterality Date  . INDUCED ABORTION    . VAGINAL DELIVERY      Family History:  Family History  Problem Relation Age of Onset  . Diabetes Mother   . Hypertension Mother     Social History:  Social History   Socioeconomic History  . Marital status: Single    Spouse name: Not on file  . Number of children: Not on file  . Years of education: Not on file  . Highest education level: Not on file   Occupational History  . Not on file  Tobacco Use  . Smoking status: Never Smoker  . Smokeless tobacco: Never Used  Vaping Use  . Vaping Use: Never used  Substance and Sexual Activity  . Alcohol use: No  . Drug use: No    Types: Marijuana    Comment: at least 1 X/day last used in first trimester  . Sexual activity: Not on file  Other Topics Concern  . Not on file  Social History Narrative  . Not on file   Social Determinants of Health   Financial Resource Strain:   . Difficulty of Paying Living Expenses: Not on file  Food Insecurity:   . Worried About Programme researcher, broadcasting/film/videounning Out of Food in the Last Year: Not on file  . Ran Out of Food in the Last Year: Not on file  Transportation Needs:   . Lack of Transportation (Medical): Not on file  . Lack of Transportation (Non-Medical): Not on file  Physical Activity:   . Days of Exercise per Week: Not on file  . Minutes of Exercise per Session: Not on file  Stress:   . Feeling of Stress : Not on file  Social Connections:   . Frequency of Communication with Friends and Family: Not on file  . Frequency of Social Gatherings  with Friends and Family: Not on file  . Attends Religious Services: Not on file  . Active Member of Clubs or Organizations: Not on file  . Attends Banker Meetings: Not on file  . Marital Status: Not on file  Intimate Partner Violence:   . Fear of Current or Ex-Partner: Not on file  . Emotionally Abused: Not on file  . Physically Abused: Not on file  . Sexually Abused: Not on file    SDOH:  SDOH Screenings   Alcohol Screen:   . Last Alcohol Screening Score (AUDIT): Not on file  Depression (PHQ2-9):   . PHQ-2 Score: Not on file  Financial Resource Strain:   . Difficulty of Paying Living Expenses: Not on file  Food Insecurity:   . Worried About Programme researcher, broadcasting/film/video in the Last Year: Not on file  . Ran Out of Food in the Last Year: Not on file  Housing:   . Last Housing Risk Score: Not on file  Physical  Activity:   . Days of Exercise per Week: Not on file  . Minutes of Exercise per Session: Not on file  Social Connections:   . Frequency of Communication with Friends and Family: Not on file  . Frequency of Social Gatherings with Friends and Family: Not on file  . Attends Religious Services: Not on file  . Active Member of Clubs or Organizations: Not on file  . Attends Banker Meetings: Not on file  . Marital Status: Not on file  Stress:   . Feeling of Stress : Not on file  Tobacco Use: Low Risk   . Smoking Tobacco Use: Never Smoker  . Smokeless Tobacco Use: Never Used  Transportation Needs:   . Freight forwarder (Medical): Not on file  . Lack of Transportation (Non-Medical): Not on file    Last Labs:  Admission on 06/07/2020  Component Date Value Ref Range Status  . SARS Coronavirus 2 Ag 06/07/2020 Negative  Negative Final  . WBC 06/07/2020 6.9  4.0 - 10.5 K/uL Final  . RBC 06/07/2020 3.90  3.87 - 5.11 MIL/uL Final  . Hemoglobin 06/07/2020 11.9* 12.0 - 15.0 g/dL Final  . HCT 84/53/6468 37.3  36 - 46 % Final  . MCV 06/07/2020 95.6  80.0 - 100.0 fL Final  . MCH 06/07/2020 30.5  26.0 - 34.0 pg Final  . MCHC 06/07/2020 31.9  30.0 - 36.0 g/dL Final  . RDW 01/02/2247 12.8  11.5 - 15.5 % Final  . Platelets 06/07/2020 363  150 - 400 K/uL Final  . nRBC 06/07/2020 0.0  0.0 - 0.2 % Final  . Neutrophils Relative % 06/07/2020 45  % Final  . Neutro Abs 06/07/2020 3.1  1.7 - 7.7 K/uL Final  . Lymphocytes Relative 06/07/2020 49  % Final  . Lymphs Abs 06/07/2020 3.3  0.7 - 4.0 K/uL Final  . Monocytes Relative 06/07/2020 5  % Final  . Monocytes Absolute 06/07/2020 0.3  0 - 1 K/uL Final  . Eosinophils Relative 06/07/2020 1  % Final  . Eosinophils Absolute 06/07/2020 0.1  0 - 0 K/uL Final  . Basophils Relative 06/07/2020 0  % Final  . Basophils Absolute 06/07/2020 0.0  0 - 0 K/uL Final  . Immature Granulocytes 06/07/2020 0  % Final  . Abs Immature Granulocytes 06/07/2020  0.01  0.00 - 0.07 K/uL Final   Performed at [facility], [address], [place], [place] [address]  . Sodium 06/07/2020 137  135 - 145 mmol/L Final  . Potassium 06/07/2020 3.4* 3.5 - 5.1 mmol/L Final  . Chloride 06/07/2020 102  98 - 111 mmol/L Final  . CO2 06/07/2020 25  22 - 32 mmol/L Final  . Glucose, Bld 06/07/2020 105* 70 - 99 mg/dL Final   Glucose reference range applies only to samples taken after fasting for at least 8 hours.  . BUN 06/07/2020 8  6 - 20 mg/dL Final  . Creatinine, Ser 06/07/2020 0.73  0.44 - 1.00 mg/dL Final  . Calcium 78/24/2353 8.9  8.9 - 10.3 mg/dL Final  . Total Protein 06/07/2020 7.3  6.5 - 8.1 g/dL Final  . Albumin 61/44/3154 4.0  3.5 - 5.0 g/dL Final  . AST 00/86/7619 21  15 - 41 U/L Final  . ALT 06/07/2020 16  0 - 44 U/L Final  . Alkaline Phosphatase 06/07/2020 80  38 - 126 U/L Final  . Total Bilirubin 06/07/2020 0.5  0.3 - 1.2 mg/dL Final  . GFR calc non Af Amer 06/07/2020 >60  >60 mL/min Final  . GFR calc Af Amer 06/07/2020 >60  >60 mL/min Final  . Anion gap 06/07/2020 10  5 - 15 Final   Performed at [facility], [address] [place], [place] [address]  . POC Amphetamine UR 06/07/2020 None Detected  None Detected Final  . POC Secobarbital (BAR) 06/07/2020 None Detected  None Detected Final  . POC Buprenorphine (BUP) 06/07/2020 None Detected  None Detected Final  . POC Oxazepam (BZO) 06/07/2020 None Detected  None Detected Final  . POC Cocaine UR 06/07/2020 None Detected  None Detected Final  . POC Methamphetamine UR 06/07/2020 None Detected  None Detected Final  . POC Morphine 06/07/2020 None Detected  None Detected Final  . POC Oxycodone UR 06/07/2020 None Detected  None Detected Final  . POC Methadone UR 06/07/2020 None Detected  None Detected Final  . POC Marijuana UR 06/07/2020 Positive* None Detected Final  . TSH 06/07/2020 2.553  0.350 - 4.500 uIU/mL Final   Comment: Performed by a 3rd Generation assay with a functional  sensitivity of <=0.01 uIU/mL. Performed at [facility], [address] [place], [place] [address]   . Preg Test, Ur 06/07/2020 NEGATIVE  NEGATIVE Final   Comment:        THE SENSITIVITY OF THIS METHODOLOGY IS >24 mIU/mL     Allergies: Other and Mushroom extract complex  PTA Medications: (Not in a hospital admission)   Medical Decision Making  Admission labs ordered  Patient reports that she is not interested in taking medications  Placed order for Hydroxyzine 25 mg TID prn for anxiety    Recommendations  Based on my evaluation the patient does not appear to have an emergency medical condition.   Patient will be placed in the continuous assessment area at [facility] for treatment and stabilization. She will be reevaluated on 06/08/2020. The treatment team will determine disposition at that time.      Jackelyn Poling, NP 06/08/20  4:04 AM

## 2020-06-07 NOTE — ED Triage Notes (Signed)
Pt is walk in to [facility], states:"Feeling depressed unmotivated for a long time but lately has been overwhelming"

## 2020-06-08 MED ORDER — ESCITALOPRAM OXALATE 10 MG PO TABS: 10.0000 mg | ORAL_TABLET | Freq: Every day | ORAL | 1 refills | Status: AC

## 2020-06-08 MED ORDER — HYDROXYZINE HCL 25 MG PO TABS: 25.0000 mg | ORAL_TABLET | Freq: Three times a day (TID) | ORAL | 1 refills | Status: AC | PRN

## 2020-06-08 MED ORDER — ESCITALOPRAM OXALATE 10 MG PO TABS
10.0000 mg | ORAL_TABLET | Freq: Every day | ORAL | Status: DC
  Administered 2020-06-08: 10 mg via ORAL
  Filled 2020-06-08: qty 1

## 2020-06-08 NOTE — ED Notes (Signed)
Pt sitting up on bed. Denies SI/HI. Calm, cooperative. Bkft offered. Safety maintained.

## 2020-06-08 NOTE — Discharge Instructions (Signed)
Take medications as prescribed Call and schedule an appointment with one of the resources provided 

## 2020-06-08 NOTE — ED Notes (Signed)
Patient A&O x 4, ambulatory. Patient discharged in no acute distress. Patient denied SI/HI, A/VH upon discharge. Patient verbalized understanding of all discharge instructions explained by staff, to include follow up care, RX's and safety plan. Pt belongings returned to patient from locker #25 intact. Patient escorted to lobby via staff for self transport to destination. Safety maintained.

## 2020-06-08 NOTE — ED Provider Notes (Signed)
FBC/OBS ASAP Discharge Summary  Date and Time: 06/08/2020 8:04 AM  Name: Margaret Spence  MRN:  [id number]   Discharge Diagnoses:  Final diagnoses:  Severe major depression, single episode, without psychotic features (HCC)    Subjective: Patient reports this morning that she is still feeling some depressed but she is not feeling suicidal.  She states that she is interested in starting medications to help her feel better.  She reports that right now she is missing her kids and wants to get home to them.  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  She states that one of her children is to her significant other and she has been assisting with taking care of them.  She states that her significant other was laid off from work for a little while and she was having to support everyone in the house.  She states that with everything going on she has just felt stressed and overwhelmed.  She reports that she feels that she is ready to go home and has agreed to start Lexapro as well as Vistaril.  She is me permission to call her significant other Thelma Barge. Contacted for collateral information and reports that he has no safety concerns with the patient discharging to coming home today.  He states that there are no weapons in the home including firearms.  He reports that he feels that she is can safely come home and is agreed to monitor her for any worsening symptoms and return the patient to the ED or call 911 if she continues to show any worsening symptoms or having suicidal ideations.  Stay Summary: Patient is a 34 year old female who presented to the [facility] due to worsening depression, anxiety, and feeling overwhelmed.  Patient has a Veterinary surgeon and her next appointment is not 06/19/20.  Patient at first was not interested in medications.  However today patient states that she knows that she needs to start something to assist with her anxiety and  depression.  He is agreed on starting Lexapro 10 mg p.o. daily and continue her Vistaril 25 mg p.o. 3 times daily as needed.  She denies any suicidal homicidal ideations and denies any hallucinations.  Patient significant other provides collateral information stating for safe discharge and has no safety concerns with her returning home.  At this time the patient does not meet inpatient psychiatric treatment criteria and is psychiatrically cleared.  Patient's prescriptions were E prescribed.  Patient was provided with resources for outpatient psychiatric treatment the call and schedule an appointment as soon as possible.  Total Time spent with patient: 30 minutes  Past Psychiatric History: Depression Past Medical History: History reviewed. No pertinent past medical history.  Past Surgical History:  Procedure Laterality Date  . INDUCED ABORTION    . VAGINAL DELIVERY     Family History:  Family History  Problem Relation Age of Onset  . Diabetes Mother   . Hypertension Mother    Family Psychiatric History: None reported Social History:  Social History   Substance and Sexual Activity  Alcohol Use No     Social History   Substance and Sexual Activity  Drug Use No  . Types: Marijuana   Comment: at least 1 X/day last used in first trimester    Social History   Socioeconomic History  . Marital status: Single    Spouse name: Not on file  . Number of children: Not on file  .  Years of education: Not on file  . Highest education level: Not on file  Occupational History  . Not on file  Tobacco Use  . Smoking status: Never Smoker  . Smokeless tobacco: Never Used  Vaping Use  . Vaping Use: Never used  Substance and Sexual Activity  . Alcohol use: No  . Drug use: No    Types: Marijuana    Comment: at least 1 X/day last used in first trimester  . Sexual activity: Not on file  Other Topics Concern  . Not on file  Social History Narrative  . Not on file   Social Determinants of  Health   Financial Resource Strain:   . Difficulty of Paying Living Expenses: Not on file  Food Insecurity:   . Worried About Programme researcher, broadcasting/film/video in the Last Year: Not on file  . Ran Out of Food in the Last Year: Not on file  Transportation Needs:   . Lack of Transportation (Medical): Not on file  . Lack of Transportation (Non-Medical): Not on file  Physical Activity:   . Days of Exercise per Week: Not on file  . Minutes of Exercise per Session: Not on file  Stress:   . Feeling of Stress : Not on file  Social Connections:   . Frequency of Communication with Friends and Family: Not on file  . Frequency of Social Gatherings with Friends and Family: Not on file  . Attends Religious Services: Not on file  . Active Member of Clubs or Organizations: Not on file  . Attends Banker Meetings: Not on file  . Marital Status: Not on file   SDOH:  SDOH Screenings   Alcohol Screen:   . Last Alcohol Screening Score (AUDIT): Not on file  Depression (PHQ2-9):   . PHQ-2 Score: Not on file  Financial Resource Strain:   . Difficulty of Paying Living Expenses: Not on file  Food Insecurity:   . Worried About Programme researcher, broadcasting/film/video in the Last Year: Not on file  . Ran Out of Food in the Last Year: Not on file  Housing:   . Last Housing Risk Score: Not on file  Physical Activity:   . Days of Exercise per Week: Not on file  . Minutes of Exercise per Session: Not on file  Social Connections:   . Frequency of Communication with Friends and Family: Not on file  . Frequency of Social Gatherings with Friends and Family: Not on file  . Attends Religious Services: Not on file  . Active Member of Clubs or Organizations: Not on file  . Attends Banker Meetings: Not on file  . Marital Status: Not on file  Stress:   . Feeling of Stress : Not on file  Tobacco Use: Low Risk   . Smoking Tobacco Use: Never Smoker  . Smokeless Tobacco Use: Never Used  Transportation Needs:   . Sales promotion account executive (Medical): Not on file  . Lack of Transportation (Non-Medical): Not on file    Has this patient used any form of tobacco in the last 30 days? (Cigarettes, Smokeless Tobacco, Cigars, and/or Pipes) Prescription not provided because: doesn't smoke  Current Medications:  Current Facility-Administered Medications  Medication Dose Route Frequency Provider Last Rate Last Admin  . acetaminophen (TYLENOL) tablet 650 mg  650 mg Oral Q6H PRN Nira Conn A, NP      . alum & mag hydroxide-simeth (MAALOX/MYLANTA) 200-200-20 MG/5ML suspension 30 mL  30 mL Oral Q4H  PRN Nira ConnBerry, Jason A, NP      . escitalopram (LEXAPRO) tablet 10 mg  10 mg Oral Daily Earnstine Meinders, Gerlene Burdockravis B, [place]      . hydrOXYzine (ATARAX/VISTARIL) tablet 25 mg  25 mg Oral TID PRN Nira ConnBerry, Jason A, NP   25 mg at 06/08/20 0033  . magnesium hydroxide (MILK OF MAGNESIA) suspension 30 mL  30 mL Oral Daily PRN Jackelyn PolingBerry, Jason A, NP       Current Outpatient Medications  Medication Sig Dispense Refill  . escitalopram (LEXAPRO) 10 MG tablet Take 1 tablet (10 mg total) by mouth daily. 30 tablet 1  . hydrOXYzine (ATARAX/VISTARIL) 25 MG tablet Take 1 tablet (25 mg total) by mouth 3 (three) times daily as needed for anxiety. 30 tablet 1  . ibuprofen (ADVIL,MOTRIN) 800 MG tablet Take 1 tablet (800 mg total) by mouth 3 (three) times daily. (Patient not taking: Reported on 06/08/2020) 30 tablet 0    PTA Medications: (Not in a hospital admission)   Musculoskeletal  Strength & Muscle Tone: within normal limits Gait & Station: normal Patient leans: N/A  Psychiatric Specialty Exam  Presentation  General Appearance: Appropriate for Environment;Casual  Eye Contact:Good  Speech:Clear and Coherent;Normal Rate  Speech Volume:Normal  Handedness:Right   Mood and Affect  Mood:Depressed  Affect:Congruent;Depressed   Thought Process  Thought Processes:Coherent  Descriptions of Associations:Intact  Orientation:Full (Time, Place and  Person)  Thought Content:WDL  Hallucinations:Hallucinations: None  Ideas of Reference:None  Suicidal Thoughts:Suicidal Thoughts: No  Homicidal Thoughts:Homicidal Thoughts: No   Sensorium  Memory:Immediate Good;Recent Good;Remote Good  Judgment:Fair  Insight:Fair   Executive Functions  Concentration:Good  Attention Span:Good  Recall:Good  Fund of Knowledge:Good  Language:Good   Psychomotor Activity  Psychomotor Activity:Psychomotor Activity: Normal   Assets  Assets:Communication Skills;Desire for Improvement;Financial Resources/Insurance;Housing;Physical Health;Social Support;Transportation   Sleep  Sleep:Sleep: Fair   Physical Exam  Physical Exam Vitals and nursing note reviewed.  Constitutional:      Appearance: She is well-developed.  Cardiovascular:     Rate and Rhythm: Normal rate.  Pulmonary:     Effort: Pulmonary effort is normal.  Musculoskeletal:        General: Normal range of motion.  Skin:    General: Skin is warm.  Neurological:     Mental Status: She is alert and oriented to person, place, and time.  Psychiatric:        Mood and Affect: Mood is depressed.    Review of Systems  Constitutional: Negative.   HENT: Negative.   Eyes: Negative.   Respiratory: Negative.   Cardiovascular: Negative.   Gastrointestinal: Negative.   Genitourinary: Negative.   Musculoskeletal: Negative.   Skin: Negative.   Neurological: Negative.   Endo/Heme/Allergies: Negative.   Psychiatric/Behavioral: Positive for depression. Negative for suicidal ideas. The patient is nervous/anxious.    Blood pressure 90/66, pulse 64, temperature 97.7 F (36.5 C), temperature source Tympanic, resp. rate 16, height 5\' 5"  (1.651 m), weight 131 lb (59.4 kg), SpO2 100 %. Body mass index is 21.8 kg/m.  Demographic Factors:  NA  Loss Factors: Financial problems/change in socioeconomic status  Historical Factors: NA  Risk Reduction Factors:   Responsible for  children under 32 years of age, Sense of responsibility to family, Employed, Living with another person, especially a relative, Positive social support and Positive therapeutic relationship  Continued Clinical Symptoms:  Depression:   Anhedonia  Cognitive Features That Contribute To Risk:  None    Suicide Risk:  Mild:  Suicidal ideation of limited frequency,  intensity, duration, and specificity.  There are no identifiable plans, no associated intent, mild dysphoria and related symptoms, good self-control (both objective and subjective assessment), few other risk factors, and identifiable protective factors, including available and accessible social support.  Plan Of Care/Follow-up recommendations:  Continue activity as tolerated. Continue diet as recommended by your PCP. Ensure to keep all appointments with outpatient providers.  Disposition: Discharge home with resources and prescriptions  Maryfrances Bunnell, FNP 06/08/2020, 8:04 AM

## 2020-06-15 DIAGNOSIS — F331 Major depressive disorder, recurrent, moderate: Secondary | ICD-10-CM | POA: Diagnosis not present

## 2020-06-28 DIAGNOSIS — F4310 Post-traumatic stress disorder, unspecified: Secondary | ICD-10-CM | POA: Diagnosis not present

## 2020-06-28 DIAGNOSIS — F331 Major depressive disorder, recurrent, moderate: Secondary | ICD-10-CM | POA: Diagnosis not present

## 2020-07-04 DIAGNOSIS — F331 Major depressive disorder, recurrent, moderate: Secondary | ICD-10-CM | POA: Diagnosis not present

## 2020-07-11 DIAGNOSIS — F331 Major depressive disorder, recurrent, moderate: Secondary | ICD-10-CM | POA: Diagnosis not present

## 2020-07-11 DIAGNOSIS — F410 Panic disorder [episodic paroxysmal anxiety] without agoraphobia: Secondary | ICD-10-CM | POA: Diagnosis not present

## 2020-07-12 DIAGNOSIS — F331 Major depressive disorder, recurrent, moderate: Secondary | ICD-10-CM | POA: Diagnosis not present

## 2020-07-12 DIAGNOSIS — F4310 Post-traumatic stress disorder, unspecified: Secondary | ICD-10-CM | POA: Diagnosis not present

## 2020-07-20 ENCOUNTER — Ambulatory Visit: Payer: BC Managed Care – PPO | Admitting: Podiatry

## 2020-07-20 DIAGNOSIS — F331 Major depressive disorder, recurrent, moderate: Secondary | ICD-10-CM | POA: Diagnosis not present

## 2020-07-20 DIAGNOSIS — J662 Cannabinosis: Secondary | ICD-10-CM | POA: Diagnosis not present

## 2020-07-27 DIAGNOSIS — F331 Major depressive disorder, recurrent, moderate: Secondary | ICD-10-CM | POA: Diagnosis not present

## 2020-07-27 DIAGNOSIS — J662 Cannabinosis: Secondary | ICD-10-CM | POA: Diagnosis not present

## 2020-07-28 DIAGNOSIS — Z20822 Contact with and (suspected) exposure to covid-19: Secondary | ICD-10-CM | POA: Diagnosis not present

## 2020-07-28 DIAGNOSIS — Z03818 Encounter for observation for suspected exposure to other biological agents ruled out: Secondary | ICD-10-CM | POA: Diagnosis not present

## 2020-08-02 DIAGNOSIS — F4310 Post-traumatic stress disorder, unspecified: Secondary | ICD-10-CM | POA: Diagnosis not present

## 2020-08-02 DIAGNOSIS — F331 Major depressive disorder, recurrent, moderate: Secondary | ICD-10-CM | POA: Diagnosis not present

## 2020-08-10 DIAGNOSIS — F331 Major depressive disorder, recurrent, moderate: Secondary | ICD-10-CM | POA: Diagnosis not present

## 2020-08-10 DIAGNOSIS — F12180 Cannabis abuse with cannabis-induced anxiety disorder: Secondary | ICD-10-CM | POA: Diagnosis not present

## 2020-08-10 DIAGNOSIS — F411 Generalized anxiety disorder: Secondary | ICD-10-CM | POA: Diagnosis not present

## 2020-08-10 DIAGNOSIS — F4310 Post-traumatic stress disorder, unspecified: Secondary | ICD-10-CM | POA: Diagnosis not present

## 2020-08-22 DIAGNOSIS — F12180 Cannabis abuse with cannabis-induced anxiety disorder: Secondary | ICD-10-CM | POA: Diagnosis not present

## 2020-08-22 DIAGNOSIS — F331 Major depressive disorder, recurrent, moderate: Secondary | ICD-10-CM | POA: Diagnosis not present

## 2020-08-22 DIAGNOSIS — F411 Generalized anxiety disorder: Secondary | ICD-10-CM | POA: Diagnosis not present

## 2020-08-22 DIAGNOSIS — F4310 Post-traumatic stress disorder, unspecified: Secondary | ICD-10-CM | POA: Diagnosis not present

## 2020-08-24 DIAGNOSIS — F331 Major depressive disorder, recurrent, moderate: Secondary | ICD-10-CM | POA: Diagnosis not present

## 2020-08-24 DIAGNOSIS — F12180 Cannabis abuse with cannabis-induced anxiety disorder: Secondary | ICD-10-CM | POA: Diagnosis not present

## 2020-08-24 DIAGNOSIS — F411 Generalized anxiety disorder: Secondary | ICD-10-CM | POA: Diagnosis not present

## 2020-08-24 DIAGNOSIS — F4310 Post-traumatic stress disorder, unspecified: Secondary | ICD-10-CM | POA: Diagnosis not present

## 2020-09-13 DIAGNOSIS — F12180 Cannabis abuse with cannabis-induced anxiety disorder: Secondary | ICD-10-CM | POA: Diagnosis not present

## 2020-09-13 DIAGNOSIS — F4310 Post-traumatic stress disorder, unspecified: Secondary | ICD-10-CM | POA: Diagnosis not present

## 2020-09-13 DIAGNOSIS — F411 Generalized anxiety disorder: Secondary | ICD-10-CM | POA: Diagnosis not present

## 2020-09-13 DIAGNOSIS — F331 Major depressive disorder, recurrent, moderate: Secondary | ICD-10-CM | POA: Diagnosis not present

## 2020-09-27 DIAGNOSIS — F331 Major depressive disorder, recurrent, moderate: Secondary | ICD-10-CM | POA: Diagnosis not present

## 2020-09-27 DIAGNOSIS — F12180 Cannabis abuse with cannabis-induced anxiety disorder: Secondary | ICD-10-CM | POA: Diagnosis not present

## 2020-09-27 DIAGNOSIS — F4310 Post-traumatic stress disorder, unspecified: Secondary | ICD-10-CM | POA: Diagnosis not present

## 2020-09-27 DIAGNOSIS — F411 Generalized anxiety disorder: Secondary | ICD-10-CM | POA: Diagnosis not present

## 2020-12-06 DIAGNOSIS — Z1159 Encounter for screening for other viral diseases: Secondary | ICD-10-CM | POA: Diagnosis not present

## 2020-12-06 DIAGNOSIS — M67432 Ganglion, left wrist: Secondary | ICD-10-CM | POA: Diagnosis not present

## 2020-12-06 DIAGNOSIS — I959 Hypotension, unspecified: Secondary | ICD-10-CM | POA: Diagnosis not present

## 2020-12-13 DIAGNOSIS — I959 Hypotension, unspecified: Secondary | ICD-10-CM | POA: Diagnosis not present

## 2020-12-13 DIAGNOSIS — Z1159 Encounter for screening for other viral diseases: Secondary | ICD-10-CM | POA: Diagnosis not present

## 2021-11-24 ENCOUNTER — Encounter (HOSPITAL_COMMUNITY): Payer: Self-pay | Admitting: Emergency Medicine

## 2021-11-24 ENCOUNTER — Emergency Department (HOSPITAL_COMMUNITY)
Admission: EM | Admit: 2021-11-24 | Discharge: 2021-11-24 | Disposition: A | Discharge status: Home / Self Care | Payer: Medicaid Other | Attending: Emergency Medicine | Admitting: Emergency Medicine

## 2021-11-24 ENCOUNTER — Other Ambulatory Visit: Payer: Self-pay

## 2021-11-24 DIAGNOSIS — N9489 Other specified conditions associated with female genital organs and menstrual cycle: Secondary | ICD-10-CM | POA: Diagnosis not present

## 2021-11-24 DIAGNOSIS — O26891 Other specified pregnancy related conditions, first trimester: Secondary | ICD-10-CM | POA: Insufficient documentation

## 2021-11-24 DIAGNOSIS — O219 Vomiting of pregnancy, unspecified: Secondary | ICD-10-CM | POA: Diagnosis not present

## 2021-11-24 DIAGNOSIS — M545 Low back pain, unspecified: Secondary | ICD-10-CM | POA: Insufficient documentation

## 2021-11-24 DIAGNOSIS — Z3A01 Less than 8 weeks gestation of pregnancy: Secondary | ICD-10-CM | POA: Diagnosis not present

## 2021-11-24 DIAGNOSIS — Z20822 Contact with and (suspected) exposure to covid-19: Secondary | ICD-10-CM | POA: Insufficient documentation

## 2021-11-24 LAB — URINALYSIS, COMPLETE (UACMP) WITH MICROSCOPIC
Bacteria, UA: NONE SEEN
Bilirubin Urine: NEGATIVE
Glucose, UA: NEGATIVE mg/dL
Hgb urine dipstick: NEGATIVE
Ketones, ur: 80 mg/dL — AB
Leukocytes,Ua: NEGATIVE
Nitrite: NEGATIVE
Protein, ur: 100 mg/dL — AB
Specific Gravity, Urine: 1.033 — ABNORMAL HIGH (ref 1.005–1.030)
pH: 5 (ref 5.0–8.0)

## 2021-11-24 LAB — CBC WITH DIFFERENTIAL/PLATELET
Abs Immature Granulocytes: 0.03 10*3/uL (ref 0.00–0.07)
Basophils Absolute: 0.1 10*3/uL (ref 0.0–0.1)
Basophils Relative: 1 %
Eosinophils Absolute: 0.2 10*3/uL (ref 0.0–0.5)
Eosinophils Relative: 2 %
HCT: 36.7 % (ref 36.0–46.0)
Hemoglobin: 12.2 g/dL (ref 12.0–15.0)
Immature Granulocytes: 0 %
Lymphocytes Relative: 25 %
Lymphs Abs: 2.1 10*3/uL (ref 0.7–4.0)
MCH: 31.7 pg (ref 26.0–34.0)
MCHC: 33.2 g/dL (ref 30.0–36.0)
MCV: 95.3 fL (ref 80.0–100.0)
Monocytes Absolute: 0.5 10*3/uL (ref 0.1–1.0)
Monocytes Relative: 6 %
Neutro Abs: 5.5 10*3/uL (ref 1.7–7.7)
Neutrophils Relative %: 66 %
Platelets: 281 10*3/uL (ref 150–400)
RBC: 3.85 MIL/uL — ABNORMAL LOW (ref 3.87–5.11)
RDW: 12.7 % (ref 11.5–15.5)
WBC: 8.4 10*3/uL (ref 4.0–10.5)
nRBC: 0 % (ref 0.0–0.2)

## 2021-11-24 LAB — COMPREHENSIVE METABOLIC PANEL
ALT: 13 U/L (ref 0–44)
AST: 19 U/L (ref 15–41)
Albumin: 4 g/dL (ref 3.5–5.0)
Alkaline Phosphatase: 52 U/L (ref 38–126)
Anion gap: 10 (ref 5–15)
BUN: 6 mg/dL (ref 6–20)
CO2: 23 mmol/L (ref 22–32)
Calcium: 9.4 mg/dL (ref 8.9–10.3)
Chloride: 103 mmol/L (ref 98–111)
Creatinine, Ser: 0.69 mg/dL (ref 0.44–1.00)
GFR, Estimated: >60 mL/min (ref >60)
Glucose, Bld: 105 mg/dL — ABNORMAL HIGH (ref 70–99)
Potassium: 3.8 mmol/L (ref 3.5–5.1)
Sodium: 136 mmol/L (ref 135–145)
Total Bilirubin: 0.7 mg/dL (ref 0.3–1.2)
Total Protein: 7.2 g/dL (ref 6.5–8.1)

## 2021-11-24 LAB — I-STAT BETA HCG BLOOD, ED (MC, WL, AP ONLY): I-stat hCG, quantitative: >2000 m[IU]/mL — ABNORMAL HIGH (ref <5)

## 2021-11-24 LAB — RESP PANEL BY RT-PCR (FLU A&B, COVID) ARPGX2
Influenza A by PCR: NEGATIVE
Influenza B by PCR: NEGATIVE
SARS Coronavirus 2 by RT PCR: NEGATIVE

## 2021-11-24 LAB — LIPASE, BLOOD: Lipase: 25 U/L (ref 11–51)

## 2021-11-24 MED ORDER — METOCLOPRAMIDE HCL 10 MG PO TABS: 10.0000 mg | ORAL_TABLET | Freq: Three times a day (TID) | ORAL | 0 refills | Status: AC | PRN

## 2021-11-24 MED ORDER — ONDANSETRON 4 MG PO TBDP
4.0000 mg | ORAL_TABLET | Freq: Once | ORAL | Status: AC
  Administered 2021-11-24: 4 mg via ORAL
  Filled 2021-11-24: qty 1

## 2021-11-24 NOTE — Discharge Instructions (Addendum)
Take only tylenol for pain. Start taking a prenatal vitamin asap.  Contact a health care provider if: You have pain in your abdomen. You have a severe headache. You have vision problems. You are losing weight. You feel weak or dizzy. You cannot eat or drink without vomiting, especially if this goes on for a full day. Get help right away if: You cannot drink fluids without vomiting. You vomit blood. You have constant nausea and vomiting. You are very weak. You faint. You have a fever and your symptoms suddenly get worse.

## 2021-11-24 NOTE — ED Provider Triage Note (Signed)
Emergency Medicine Provider Triage Evaluation Note  Margaret Spence , a 36 y.o. female  was evaluated in triage.  Pt complains of back pain, nausea and vomiting.  Patient has had left low back pain for 1 month which is worse with movement twisting bending and coughing.  Yesterday she started having symptoms of nausea.  She has had several episodes of vomiting, no diarrhea, she has loss of appetite, fatigue chills.  She denies URI symptoms.  LMP 10/06/2022. No urinary sxs  Review of Systems  Positive: vomiting Negative: fever  Physical Exam  There were no vitals taken for this visit. Gen:   Awake, no distress   Resp:  Normal effort  MSK:   Moves extremities without difficulty  Other:  Spasm and tenderness Left lower back. No abdominal tenderness  Medical Decision Making  Medically screening exam initiated at 1:23 AM.  Appropriate orders placed.  MAECI KALBFLEISCH was informed that the remainder of the evaluation will be completed by another provider, this initial triage assessment does not replace that evaluation, and the importance of remaining in the ED until their evaluation is complete.  Work up [employer], [place], PA-C 11/24/21 0128

## 2021-11-24 NOTE — ED Provider Notes (Signed)
[facility] EMERGENCY DEPARTMENT Provider Note   CSN: [id number] Arrival date & time: 11/24/21  0114     History  Chief Complaint  Patient presents with   Back Pain   Nausea    Margaret Spence is a 36 y.o. female who complains of back pain, nausea and vomiting.  Patient has had left low back pain for 1 month which is worse with movement twisting bending and coughing.  Yesterday she started having symptoms of nausea.  She has had several episodes of vomiting, no diarrhea, she has loss of appetite, fatigue chills.  She denies URI symptoms.  LMP 10/06/2022. No urinary sxs complains of back pain, nausea and vomiting.  Patient has had left low back pain for 1 month which is worse with movement twisting bending and coughing.  Denies weakness, loss of bowel/bladder function or saddle anesthesia. Denies neck stiffness, headache, rash.  Denies fever or recent procedures to back. Yesterday she started having symptoms of nausea.  She has had several episodes of vomiting, no diarrhea, she has loss of appetite, fatigue chills.  She denies URI symptoms.  LMP 10/06/2022. No urinary sxs   Back Pain     Home Medications Prior to Admission medications   Medication Sig Start Date End Date Taking? Authorizing Provider  escitalopram (LEXAPRO) 10 MG tablet Take 1 tablet (10 mg total) by mouth daily. 06/08/20   Money, Lowry Ram, FNP  hydrOXYzine (ATARAX/VISTARIL) 25 MG tablet Take 1 tablet (25 mg total) by mouth 3 (three) times daily as needed for anxiety. 06/08/20   Money, Lowry Ram, FNP  ibuprofen (ADVIL,MOTRIN) 800 MG tablet Take 1 tablet (800 mg total) by mouth 3 (three) times daily. Patient not taking: Reported on 06/08/2020 04/26/18   Zigmund Gottron, NP      Allergies    Other and Mushroom extract complex    Review of Systems   Review of Systems  Musculoskeletal:  Positive for back pain.   Physical Exam Updated Vital Signs BP 105/76    Pulse 73    Temp 97.9 F (36.6 C)  (Oral)    Resp 16    SpO2 100%  Physical Exam Vitals and nursing note reviewed.  Constitutional:      General: She is not in acute distress.    Appearance: She is well-developed. She is not diaphoretic.  HENT:     Head: Normocephalic and atraumatic.     Right Ear: External ear normal.     Left Ear: External ear normal.     Nose: Nose normal.     Mouth/Throat:     Mouth: Mucous membranes are moist.  Eyes:     General: No scleral icterus.    Conjunctiva/sclera: Conjunctivae normal.  Cardiovascular:     Rate and Rhythm: Normal rate and regular rhythm.     Heart sounds: Normal heart sounds. No murmur heard.   No friction rub. No gallop.  Pulmonary:     Effort: Pulmonary effort is normal. No respiratory distress.     Breath sounds: Normal breath sounds.  Abdominal:     General: Bowel sounds are normal. There is no distension.     Palpations: Abdomen is soft. There is no mass.     Tenderness: There is no abdominal tenderness. There is no guarding.     Comments: No abdominal tenderness  Musculoskeletal:     Cervical back: Normal range of motion.     Comments: Midline spinal tenderness, tenderness to palpation and spastic tissues  on the left.  Range of motion full, pain with movement negative straight leg test normal DTRs and strength  Skin:    General: Skin is warm and dry.  Neurological:     Mental Status: She is alert and oriented to person, place, and time.  Psychiatric:        Behavior: Behavior normal.    ED Results / Procedures / Treatments   Labs (all labs ordered are listed, but only abnormal results are displayed) Labs Reviewed  CBC WITH DIFFERENTIAL/PLATELET - Abnormal; Notable for the following components:      Result Value   RBC 3.85 (*)    All other components within normal limits  COMPREHENSIVE METABOLIC PANEL - Abnormal; Notable for the following components:   Glucose, Bld 105 (*)    All other components within normal limits  URINALYSIS, COMPLETE (UACMP) WITH  MICROSCOPIC - Abnormal; Notable for the following components:   Color, Urine AMBER (*)    APPearance HAZY (*)    Specific Gravity, Urine 1.033 (*)    Ketones, ur 80 (*)    Protein, ur 100 (*)    All other components within normal limits  I-STAT BETA HCG BLOOD, ED ([facility], [facility], [facility] ONLY) - Abnormal; Notable for the following components:   I-stat hCG, quantitative >2,000.0 (*)    All other components within normal limits  RESP PANEL BY RT-PCR (FLU A&B, COVID) ARPGX2  LIPASE, BLOOD    EKG None  Radiology No results found.  Procedures Procedures    Medications Ordered in ED Medications  ondansetron (ZOFRAN-ODT) disintegrating tablet 4 mg (4 mg Oral Given 11/24/21 0149)    ED Course/ Medical Decision Making/ A&P Clinical Course as of 11/24/21 0328  Sat Nov 24, 2021  0325 I-Stat beta hCG blood, ED(!) Patient has positive pregnancy test [AH]  0327 Comprehensive metabolic panel(!) CMP without significant abnormality [AH]  0327 Resp Panel by RT-PCR (Flu A&B, Covid) Nasopharyngeal Swab Negative COVID and influenza [AH]  0327 CBC with Differential(!) CBC without significant abnormality [AH]  0327 Urine positive for ketones [AH]    Clinical Course User Index [AH] Margarita Mail, PA-C                           Medical Decision Making 36 year old female here with vomiting.The emergent differential diagnosis for vomiting includes, but is not limited to ACS/MI, DKA, Ischemic bowel, Meningitis, Sepsis, Acute gastric dilation, Adrenal insufficiency, Appendicitis,  Bowel obstruction/ileus, Carbon monoxide poisoning, Cholecystitis, Electrolyte abnormalities, Elevated ICP, Gastric outlet obstruction, Pancreatitis, Ruptured viscus, Biliary colic, Cannabinoid hyperemesis syndrome, Gastritis, Gastroenteritis, Gastroparesis,  Narcotic withdrawal, Peptic ulcer disease, and UTI It appears that she is in early pregnancy she has a history of hyperemesis gravidarum and this is likely the cause of her  vomiting and fatigue.  Secondarily patient is having back pain  The emergent differential diagnosis for back pain includes but is not limited to fracture, muscle strain, cauda equina, spinal stenosis. DDD, ankylosing spondylitis, acute ligamentous injury, disk herniation, spondylolisthesis, Epidural compression syndrome, metastatic cancer, transverse myelitis, vertebral osteomyelitis, diskitis, kidney stone, pyelonephritis, AAA, Perforated ulcer, Retrocecal appendicitis, pancreatitis, bowel obstruction, retroperitoneal hemorrhage or mass, meningitis. Do not suspect emergent cause.  This appears to be musculoskeletal.  Patient may take Tylenol for musculoskeletal pain.  Will discharge with Reglan for vomiting.  Patient given referral to the [facility].  She appears otherwise appropriate for discharge at this time.  She had one episode of active vomiting here  Amount and/or Complexity of Data Reviewed Labs: ordered. Decision-making details documented in ED Course.  Risk Prescription drug management.  Final Clinical Impression(s) / ED Diagnoses Final diagnoses:  None    Rx / DC Orders ED Discharge Orders     None         Margarita Mail, PA-C 11/24/21 0329    Veryl Speak, MD 11/24/21 [phone]

## 2021-11-24 NOTE — ED Triage Notes (Signed)
Pt reported to ED with several complaints, including lower left sided back pain x1 month and new onset fatigue, nausea/vomiting, headache since yesterday. Pt denies and urinary symptoms. States LMP was 10/06/2021.

## 2021-11-24 NOTE — ED Notes (Signed)
Pt refused discharge vital signs. This RN offered additional dose of medication for nausea and pt declined, stating it did not help previously.

## 2023-01-09 ENCOUNTER — Other Ambulatory Visit: Payer: Self-pay

## 2023-01-09 ENCOUNTER — Emergency Department (HOSPITAL_COMMUNITY)
Admission: EM | Admit: 2023-01-09 | Discharge: 2023-01-09 | Disposition: A | Discharge status: Home / Self Care | Payer: Self-pay | Attending: Emergency Medicine | Admitting: Emergency Medicine

## 2023-01-09 DIAGNOSIS — W230XXA Caught, crushed, jammed, or pinched between moving objects, initial encounter: Secondary | ICD-10-CM | POA: Insufficient documentation

## 2023-01-09 DIAGNOSIS — S01111A Laceration without foreign body of right eyelid and periocular area, initial encounter: Secondary | ICD-10-CM | POA: Insufficient documentation

## 2023-01-09 DIAGNOSIS — Z23 Encounter for immunization: Secondary | ICD-10-CM | POA: Insufficient documentation

## 2023-01-09 MED ORDER — TETANUS-DIPHTH-ACELL PERTUSSIS 5-2.5-18.5 LF-MCG/0.5 IM SUSY
0.5000 mL | PREFILLED_SYRINGE | Freq: Once | INTRAMUSCULAR | Status: AC
  Administered 2023-01-09: 0.5 mL via INTRAMUSCULAR
  Filled 2023-01-09: qty 0.5

## 2023-01-09 NOTE — Discharge Instructions (Signed)
You can leave the Steri-Strips in place until they fall off on their own, you can reapply for 1 to 2 weeks, monitor for signs of infection, I would apply some Polysporin, Neosporin as needed to the affected area

## 2023-01-09 NOTE — ED Triage Notes (Signed)
Patient coming to ED for evaluation of laceration to R eyebrow.  Reports she was getting out of car after a long drive and accidentally hit her head.  No reports of LOC.  Bleeding controlled at this time.  Unknown when last tetanus shot was.

## 2023-01-09 NOTE — ED Provider Notes (Signed)
[facility] Provider Note   CSN: [id number] Arrival date & time: 01/09/23  2123     History  Chief Complaint  Patient presents with   Laceration    Margaret Spence is a 37 y.o. female with noncontributory past medical history who presents with concern for a laceration to the right eyebrow.  Patient reports that she was driving for about 10 hours, open door and smacked herself in the face with her car door.  She does not know when her last tetanus shot was.  Bleeding, pain controlled at time of my evaluation.   Laceration      Home Medications Prior to Admission medications   Medication Sig Start Date End Date Taking? Authorizing Provider  escitalopram (LEXAPRO) 10 MG tablet Take 1 tablet (10 mg total) by mouth daily. 06/08/20   Money, Lowry Ram, FNP  hydrOXYzine (ATARAX/VISTARIL) 25 MG tablet Take 1 tablet (25 mg total) by mouth 3 (three) times daily as needed for anxiety. 06/08/20   Money, Lowry Ram, FNP  ibuprofen (ADVIL,MOTRIN) 800 MG tablet Take 1 tablet (800 mg total) by mouth 3 (three) times daily. Patient not taking: Reported on 06/08/2020 04/26/18   Zigmund Gottron, NP  metoCLOPramide (REGLAN) 10 MG tablet Take 1 tablet (10 mg total) by mouth 3 (three) times daily as needed for nausea (headache / nausea). 11/24/21   Margarita Mail, PA-C      Allergies    Other and Mushroom extract complex    Review of Systems   Review of Systems  All other systems reviewed and are negative.   Physical Exam Updated Vital Signs BP 95/71 (BP Location: Right Arm)   Pulse 64   Temp 98.1 F (36.7 C) (Oral)   Resp 18   SpO2 99%  Physical Exam Vitals and nursing note reviewed.  Constitutional:      General: She is not in acute distress.    Appearance: Normal appearance.  HENT:     Head: Normocephalic and atraumatic.  Eyes:     General:        Right eye: No discharge.        Left eye: No discharge.  Cardiovascular:     Rate and  Rhythm: Normal rate and regular rhythm.  Pulmonary:     Effort: Pulmonary effort is normal. No respiratory distress.  Musculoskeletal:        General: No deformity.  Skin:    General: Skin is warm and dry.  Neurological:     Mental Status: She is alert and oriented to person, place, and time.  Psychiatric:        Mood and Affect: Mood normal.        Behavior: Behavior normal.     ED Results / Procedures / Treatments   Labs (all labs ordered are listed, but only abnormal results are displayed) Labs Reviewed - No data to display  EKG None  Radiology No results found.  Procedures .Marland KitchenLaceration Repair  Date/Time: 01/09/2023 10:19 PM  Performed by: Anselmo Pickler, PA-C Authorized by: Anselmo Pickler, PA-C   Consent:    Consent obtained:  Verbal   Consent given by:  Patient   Risks, benefits, and alternatives were discussed: yes     Risks discussed:  Infection, pain and poor cosmetic result   Alternatives discussed:  No treatment Universal protocol:    Procedure explained and questions answered to patient or proxy's satisfaction: yes     Patient identity  confirmed:  Verbally with patient Anesthesia:    Anesthesia method:  None Laceration details:    Location:  Face   Face location:  R eyebrow   Length (cm):  2.5 Treatment:    Area cleansed with:  Saline   Amount of cleaning:  Standard Skin repair:    Repair method:  Steri-Strips   Number of Steri-Strips:  3 Approximation:    Approximation:  Close Repair type:    Repair type:  Simple Post-procedure details:    Dressing:  Open (no dressing)   Procedure completion:  Tolerated     Medications Ordered in ED Medications  Tdap (BOOSTRIX) injection 0.5 mL (has no administration in time range)    ED Course/ Medical Decision Making/ A&P                             Medical Decision Making Risk Prescription drug management.   This patient is a 37 y.o. female who presents to the ED for concern of  laceration to right eyebrow.   Differential diagnoses prior to evaluation: Laceration requiring repair, foreign body, need for tetanus vaccination, versus other  Past Medical History / Social History / Additional history: Chart reviewed. Pertinent results include: Overall noncontributory  Physical Exam: Physical exam performed. The pertinent findings include: With not significantly dehisced, nonbleeding, 2 to 2.5 cm laceration of right medial eyebrow.  Full exploration of the wound reveals no foreign body, or significant spreading of the wound with manipulation, discussed with patient, do not think that this would benefit significantly from suture repair, I think that it would heal appropriately with Steri-Strips alone.    Medications / Treatment: Tetanus updated   Disposition: After consideration of the diagnostic results and the patients response to treatment, I feel that patient is stable for discharge at this time, return precautions given.   emergency department workup does not suggest an emergent condition requiring admission or immediate intervention beyond what has been performed at this time. The plan is: as above. The patient is safe for discharge and has been instructed to return immediately for worsening symptoms, change in symptoms or any other concerns.  Final Clinical Impression(s) / ED Diagnoses Final diagnoses:  Laceration of right eyebrow, initial encounter    Rx / DC Orders ED Discharge Orders     None         Margaret Spence 01/09/23 2221    Margaret Pence, MD 01/09/23 2310
